# Patient Record
Sex: Male | Born: 1965 | Hispanic: Yes | Marital: Single | Smoking: Former smoker
Health system: Southern US, Community
[De-identification: ages and names within clinical notes are randomized; demographics above are authoritative.]

## PROBLEM LIST (undated history)

## (undated) DIAGNOSIS — B20 Human immunodeficiency virus [HIV] disease: Secondary | ICD-10-CM

## (undated) DIAGNOSIS — Z21 Asymptomatic human immunodeficiency virus [HIV] infection status: Secondary | ICD-10-CM

## (undated) DIAGNOSIS — M4800 Spinal stenosis, site unspecified: Secondary | ICD-10-CM

## (undated) DIAGNOSIS — G5600 Carpal tunnel syndrome, unspecified upper limb: Secondary | ICD-10-CM

## (undated) DIAGNOSIS — G4700 Insomnia, unspecified: Secondary | ICD-10-CM

## (undated) DIAGNOSIS — F32A Depression, unspecified: Secondary | ICD-10-CM

## (undated) DIAGNOSIS — E785 Hyperlipidemia, unspecified: Secondary | ICD-10-CM

## (undated) HISTORY — DX: Carpal tunnel syndrome, unspecified upper limb: G56.00

## (undated) HISTORY — DX: Hyperlipidemia, unspecified: E78.5

## (undated) HISTORY — DX: Spinal stenosis, site unspecified: M48.00

## (undated) HISTORY — PX: WRIST ARTHROPLASTY: SHX1088

## (undated) HISTORY — DX: Human immunodeficiency virus (HIV) disease: B20

## (undated) HISTORY — DX: Asymptomatic human immunodeficiency virus (hiv) infection status: Z21

## (undated) HISTORY — DX: Depression, unspecified: F32.A

## (undated) HISTORY — DX: Insomnia, unspecified: G47.00

---

## 2004-03-17 ENCOUNTER — Emergency Department (HOSPITAL_COMMUNITY): Admission: EM | Admit: 2004-03-17 | Discharge: 2004-03-17 | Payer: Self-pay | Admitting: Emergency Medicine

## 2006-07-23 ENCOUNTER — Encounter: Admission: RE | Admit: 2006-07-23 | Discharge: 2006-07-23 | Payer: Self-pay | Admitting: Occupational Medicine

## 2006-12-25 ENCOUNTER — Encounter: Admission: RE | Admit: 2006-12-25 | Discharge: 2006-12-25 | Payer: Self-pay | Admitting: Internal Medicine

## 2006-12-25 ENCOUNTER — Ambulatory Visit: Payer: Self-pay | Admitting: Internal Medicine

## 2006-12-25 DIAGNOSIS — B20 Human immunodeficiency virus [HIV] disease: Secondary | ICD-10-CM | POA: Insufficient documentation

## 2006-12-25 LAB — CONVERTED CEMR LAB
ALT: 18 units/L (ref 0–53)
AST: 17 units/L (ref 0–37)
Albumin: 4.1 g/dL (ref 3.5–5.2)
Alkaline Phosphatase: 76 units/L (ref 39–117)
Basophils Absolute: 0 10*3/uL (ref 0.0–0.1)
Basophils Relative: 0 % (ref 0–1)
Bilirubin Urine: NEGATIVE
CO2: 24 meq/L (ref 19–32)
Chloride: 105 meq/L (ref 96–112)
GC Probe Amp, Urine: NEGATIVE
Glucose, Bld: 97 mg/dL (ref 70–99)
HCT: 44.6 % (ref 39.0–52.0)
HCV Ab: NEGATIVE
HCV Ab: NEGATIVE
HDL: 48 mg/dL (ref >39)
HIV 1 RNA Quant: 127000 copies/mL — ABNORMAL HIGH (ref <50)
HIV-1 antibody: POSITIVE — AB
HIV-2 Ab: NEGATIVE
HIV: REACTIVE
Hep B Core Total Ab: POSITIVE — AB
Hep B S Ab: POSITIVE
Hepatitis B Surface Ag: NEGATIVE
LDL Cholesterol: 87 mg/dL (ref 0–99)
Leukocytes, UA: NEGATIVE
Lymphocytes Relative: 27 % (ref 12–46)
Lymphs Abs: 1.6 10*3/uL (ref 0.7–3.3)
Monocytes Relative: 12 % — ABNORMAL HIGH (ref 3–11)
Nitrite: NEGATIVE
Platelets: 306 10*3/uL (ref 150–400)
RBC: 5.24 M/uL (ref 4.22–5.81)
RDW: 14 % (ref 11.5–14.0)
Total Bilirubin: 0.8 mg/dL (ref 0.3–1.2)
Triglycerides: 113 mg/dL (ref <150)
WBC: 6 10*3/uL (ref 4.0–10.5)

## 2006-12-26 DIAGNOSIS — B1910 Unspecified viral hepatitis B without hepatic coma: Secondary | ICD-10-CM | POA: Insufficient documentation

## 2007-01-09 ENCOUNTER — Ambulatory Visit: Payer: Self-pay | Admitting: Internal Medicine

## 2007-01-13 ENCOUNTER — Encounter: Payer: Self-pay | Admitting: Internal Medicine

## 2007-01-27 ENCOUNTER — Ambulatory Visit: Payer: Self-pay | Admitting: Internal Medicine

## 2007-01-27 ENCOUNTER — Encounter: Admission: RE | Admit: 2007-01-27 | Discharge: 2007-01-27 | Payer: Self-pay | Admitting: Internal Medicine

## 2007-01-27 LAB — CONVERTED CEMR LAB
CO2: 23 meq/L (ref 19–32)
Calcium: 9.3 mg/dL (ref 8.4–10.5)
Chloride: 105 meq/L (ref 96–112)
Creatinine, Ser: 1.03 mg/dL (ref 0.40–1.50)
Eosinophils Relative: 3 % (ref 0–5)
HCT: 44.5 % (ref 39.0–52.0)
HIV-1 RNA Quant, Log: 4.57 — ABNORMAL HIGH (ref <1.70)
Lymphocytes Relative: 41 % (ref 12–46)
Lymphs Abs: 2.1 10*3/uL (ref 0.7–3.3)
MCHC: 32.4 g/dL (ref 30.0–36.0)
MCV: 86.7 fL (ref 78.0–100.0)
Neutro Abs: 2.4 10*3/uL (ref 1.7–7.7)
Potassium: 4.6 meq/L (ref 3.5–5.3)
RBC: 5.13 M/uL (ref 4.22–5.81)
RDW: 14.2 % — ABNORMAL HIGH (ref 11.5–14.0)
Total Bilirubin: 0.9 mg/dL (ref 0.3–1.2)
Total Protein: 8 g/dL (ref 6.0–8.3)
WBC: 5.2 10*3/uL (ref 4.0–10.5)

## 2007-02-10 ENCOUNTER — Ambulatory Visit: Payer: Self-pay | Admitting: Internal Medicine

## 2007-02-10 DIAGNOSIS — J309 Allergic rhinitis, unspecified: Secondary | ICD-10-CM | POA: Insufficient documentation

## 2007-03-10 ENCOUNTER — Emergency Department (HOSPITAL_COMMUNITY): Admission: EM | Admit: 2007-03-10 | Discharge: 2007-03-10 | Payer: Self-pay | Admitting: Emergency Medicine

## 2007-03-15 ENCOUNTER — Ambulatory Visit (HOSPITAL_COMMUNITY): Admission: RE | Admit: 2007-03-15 | Discharge: 2007-03-15 | Payer: Self-pay | Admitting: Urology

## 2007-05-11 ENCOUNTER — Ambulatory Visit: Payer: Self-pay | Admitting: Internal Medicine

## 2007-05-11 ENCOUNTER — Encounter: Admission: RE | Admit: 2007-05-11 | Discharge: 2007-05-11 | Payer: Self-pay | Admitting: Internal Medicine

## 2007-05-11 LAB — CONVERTED CEMR LAB
ALT: 16 units/L (ref 0–53)
Albumin: 4.3 g/dL (ref 3.5–5.2)
Alkaline Phosphatase: 68 units/L (ref 39–117)
Basophils Absolute: 0 10*3/uL (ref 0.0–0.1)
Calcium: 9.1 mg/dL (ref 8.4–10.5)
Creatinine, Ser: 0.89 mg/dL (ref 0.40–1.50)
Glucose, Bld: 92 mg/dL (ref 70–99)
HIV-1 RNA Quant, Log: 3.74 — ABNORMAL HIGH (ref <1.70)
Lymphs Abs: 1.7 10*3/uL (ref 0.7–4.0)
MCHC: 32.4 g/dL (ref 30.0–36.0)
MCV: 86.3 fL (ref 78.0–100.0)
Monocytes Relative: 14 % — ABNORMAL HIGH (ref 3–12)
Neutro Abs: 2.5 10*3/uL (ref 1.7–7.7)
Neutrophils Relative %: 51 % (ref 43–77)
RBC: 4.75 M/uL (ref 4.22–5.81)
RDW: 13.6 % (ref 11.5–15.5)
Sodium: 142 meq/L (ref 135–145)

## 2007-05-20 ENCOUNTER — Encounter: Payer: Self-pay | Admitting: Internal Medicine

## 2007-05-20 LAB — CONVERTED CEMR LAB: HCV Ab: NEGATIVE

## 2007-06-01 ENCOUNTER — Encounter: Payer: Self-pay | Admitting: Internal Medicine

## 2007-06-02 ENCOUNTER — Encounter (INDEPENDENT_AMBULATORY_CARE_PROVIDER_SITE_OTHER): Payer: Self-pay | Admitting: *Deleted

## 2007-06-02 ENCOUNTER — Ambulatory Visit: Payer: Self-pay | Admitting: Internal Medicine

## 2007-06-02 DIAGNOSIS — M5126 Other intervertebral disc displacement, lumbar region: Secondary | ICD-10-CM | POA: Insufficient documentation

## 2007-06-26 ENCOUNTER — Encounter: Payer: Self-pay | Admitting: Internal Medicine

## 2007-08-31 ENCOUNTER — Encounter: Admission: RE | Admit: 2007-08-31 | Discharge: 2007-08-31 | Payer: Self-pay | Admitting: Internal Medicine

## 2007-08-31 ENCOUNTER — Ambulatory Visit: Payer: Self-pay | Admitting: Internal Medicine

## 2007-08-31 LAB — CONVERTED CEMR LAB
ALT: 18 units/L (ref 0–53)
Basophils Absolute: 0 10*3/uL (ref 0.0–0.1)
Basophils Relative: 0 % (ref 0–1)
CO2: 24 meq/L (ref 19–32)
Creatinine, Ser: 1.07 mg/dL (ref 0.40–1.50)
Eosinophils Absolute: 0.1 10*3/uL (ref 0.0–0.7)
Glucose, Bld: 79 mg/dL (ref 70–99)
HIV-1 RNA Quant, Log: 3.14 — ABNORMAL HIGH (ref <1.70)
Hemoglobin: 14.5 g/dL (ref 13.0–17.0)
Lymphocytes Relative: 32 % (ref 12–46)
Lymphs Abs: 2 10*3/uL (ref 0.7–4.0)
Monocytes Absolute: 0.5 10*3/uL (ref 0.1–1.0)
Monocytes Relative: 9 % (ref 3–12)
Neutrophils Relative %: 58 % (ref 43–77)
RBC: 5.08 M/uL (ref 4.22–5.81)
Total Bilirubin: 0.6 mg/dL (ref 0.3–1.2)

## 2007-09-04 ENCOUNTER — Encounter: Payer: Self-pay | Admitting: Internal Medicine

## 2007-09-15 ENCOUNTER — Ambulatory Visit: Payer: Self-pay | Admitting: Internal Medicine

## 2007-12-14 ENCOUNTER — Ambulatory Visit: Payer: Self-pay | Admitting: Internal Medicine

## 2007-12-14 ENCOUNTER — Encounter: Admission: RE | Admit: 2007-12-14 | Discharge: 2007-12-14 | Payer: Self-pay | Admitting: Internal Medicine

## 2007-12-14 LAB — CONVERTED CEMR LAB
BUN: 14 mg/dL (ref 6–23)
Basophils Absolute: 0 10*3/uL (ref 0.0–0.1)
Chloride: 106 meq/L (ref 96–112)
Creatinine, Ser: 0.87 mg/dL (ref 0.40–1.50)
Glucose, Bld: 92 mg/dL (ref 70–99)
HCT: 41.7 % (ref 39.0–52.0)
Hemoglobin: 14.1 g/dL (ref 13.0–17.0)
MCHC: 33.8 g/dL (ref 30.0–36.0)
Neutro Abs: 2.5 10*3/uL (ref 1.7–7.7)
Neutrophils Relative %: 52 % (ref 43–77)
RDW: 13.5 % (ref 11.5–15.5)
Total Bilirubin: 0.9 mg/dL (ref 0.3–1.2)
Total Protein: 7.6 g/dL (ref 6.0–8.3)
WBC: 4.8 10*3/uL (ref 4.0–10.5)

## 2007-12-29 ENCOUNTER — Ambulatory Visit: Payer: Self-pay | Admitting: Internal Medicine

## 2007-12-29 DIAGNOSIS — B86 Scabies: Secondary | ICD-10-CM | POA: Insufficient documentation

## 2008-01-12 ENCOUNTER — Ambulatory Visit: Payer: Self-pay | Admitting: Internal Medicine

## 2008-01-22 ENCOUNTER — Ambulatory Visit: Payer: Self-pay | Admitting: Internal Medicine

## 2008-01-22 LAB — CONVERTED CEMR LAB
Calcium: 9.3 mg/dL (ref 8.4–10.5)
Chloride: 105 meq/L (ref 96–112)
Cholesterol: 201 mg/dL — ABNORMAL HIGH (ref 0–200)
Creatinine, Ser: 1.01 mg/dL (ref 0.40–1.50)
Glucose, Bld: 88 mg/dL (ref 70–99)
Total CHOL/HDL Ratio: 3.8

## 2008-01-25 ENCOUNTER — Encounter: Payer: Self-pay | Admitting: Internal Medicine

## 2008-02-04 ENCOUNTER — Ambulatory Visit: Payer: Self-pay | Admitting: Internal Medicine

## 2008-02-04 LAB — CONVERTED CEMR LAB
ALT: 15 units/L (ref 0–53)
AST: 14 units/L (ref 0–37)
Bilirubin Urine: NEGATIVE
CO2: 25 meq/L (ref 19–32)
Creatinine, Ser: 0.98 mg/dL (ref 0.40–1.50)
HIV 1 RNA Quant: 18033 copies/mL
Indirect Bilirubin: 0.5 mg/dL (ref 0.0–0.9)
Ketones, ur: NEGATIVE mg/dL
Phosphorus: 3.8 mg/dL (ref 2.3–4.6)
Potassium: 4.3 meq/L (ref 3.5–5.3)
Protein, ur: NEGATIVE mg/dL
Total Bilirubin: 0.6 mg/dL (ref 0.3–1.2)
Total Protein: 7.6 g/dL (ref 6.0–8.3)

## 2008-03-02 ENCOUNTER — Ambulatory Visit: Payer: Self-pay | Admitting: Internal Medicine

## 2008-03-31 ENCOUNTER — Ambulatory Visit: Payer: Self-pay | Admitting: Internal Medicine

## 2008-03-31 LAB — CONVERTED CEMR LAB
AST: 21 units/L (ref 0–37)
Alkaline Phosphatase: 78 units/L (ref 39–117)
Basophils Relative: 0 % (ref 0–1)
Bilirubin, Direct: 0.1 mg/dL (ref 0.0–0.3)
CO2: 21 meq/L (ref 19–32)
Calcium: 9 mg/dL (ref 8.4–10.5)
Creatinine, Ser: 1.13 mg/dL (ref 0.40–1.50)
Glucose, Bld: 96 mg/dL (ref 70–99)
Indirect Bilirubin: 0.5 mg/dL (ref 0.0–0.9)
Lymphs Abs: 1.9 10*3/uL (ref 0.7–4.0)
Monocytes Absolute: 0.5 10*3/uL (ref 0.1–1.0)
Platelets: 287 10*3/uL (ref 150–400)
RBC: 5.09 M/uL (ref 4.22–5.81)
RDW: 13.9 % (ref 11.5–15.5)
Total Protein: 7.8 g/dL (ref 6.0–8.3)

## 2008-05-23 ENCOUNTER — Encounter: Payer: Self-pay | Admitting: Internal Medicine

## 2008-05-23 ENCOUNTER — Ambulatory Visit: Payer: Self-pay | Admitting: Internal Medicine

## 2008-05-23 LAB — CONVERTED CEMR LAB
CD4 Count: 652 microliters
Chloride: 105 meq/L (ref 96–112)
Cholesterol: 212 mg/dL — ABNORMAL HIGH (ref 0–200)
Glucose, Bld: 101 mg/dL — ABNORMAL HIGH (ref 70–99)
HDL: 52 mg/dL (ref >39)
HIV 1 RNA Quant: 39 copies/mL
Hep A Total Ab: POSITIVE — AB
Hep B Core Total Ab: POSITIVE — AB
LDL Cholesterol: 128 mg/dL — ABNORMAL HIGH (ref 0–99)
Sodium: 141 meq/L (ref 135–145)
Total CHOL/HDL Ratio: 4.1
Triglycerides: 159 mg/dL — ABNORMAL HIGH (ref <150)
VLDL: 32 mg/dL (ref 0–40)

## 2008-05-25 ENCOUNTER — Encounter: Payer: Self-pay | Admitting: Internal Medicine

## 2008-05-25 LAB — CONVERTED CEMR LAB: Hepatitis B Surface Ag: NEGATIVE

## 2008-06-07 ENCOUNTER — Ambulatory Visit: Payer: Self-pay | Admitting: Internal Medicine

## 2008-06-07 DIAGNOSIS — J1189 Influenza due to unidentified influenza virus with other manifestations: Secondary | ICD-10-CM | POA: Insufficient documentation

## 2008-06-20 ENCOUNTER — Encounter: Payer: Self-pay | Admitting: Internal Medicine

## 2008-07-15 ENCOUNTER — Encounter: Payer: Self-pay | Admitting: *Deleted

## 2008-07-21 ENCOUNTER — Ambulatory Visit: Payer: Self-pay | Admitting: Internal Medicine

## 2008-10-13 ENCOUNTER — Ambulatory Visit: Payer: Self-pay | Admitting: Internal Medicine

## 2008-11-04 ENCOUNTER — Encounter: Payer: Self-pay | Admitting: *Deleted

## 2009-01-06 ENCOUNTER — Ambulatory Visit: Payer: Self-pay | Admitting: Internal Medicine

## 2009-01-14 ENCOUNTER — Emergency Department (HOSPITAL_COMMUNITY): Admission: EM | Admit: 2009-01-14 | Discharge: 2009-01-14 | Payer: Self-pay | Admitting: Emergency Medicine

## 2009-04-18 ENCOUNTER — Ambulatory Visit: Payer: Self-pay | Admitting: Internal Medicine

## 2009-04-18 ENCOUNTER — Encounter: Payer: Self-pay | Admitting: Internal Medicine

## 2009-04-18 LAB — CONVERTED CEMR LAB
ALT: 17 units/L (ref 0–53)
AST: 17 units/L (ref 0–37)
Albumin: 4.3 g/dL (ref 3.5–5.2)
Alkaline Phosphatase: 84 units/L (ref 39–117)
CD4 Count: 540 microliters
CO2: 24 meq/L (ref 19–32)
Glucose, Bld: 80 mg/dL (ref 70–99)
HIV 1 RNA Quant: 39 copies/mL
Phosphorus: 2.9 mg/dL (ref 2.3–4.6)
Sodium: 140 meq/L (ref 135–145)
Total Bilirubin: 0.4 mg/dL (ref 0.3–1.2)
Total CHOL/HDL Ratio: 3.3
Triglycerides: 155 mg/dL — ABNORMAL HIGH (ref <150)

## 2009-05-09 ENCOUNTER — Ambulatory Visit: Payer: Self-pay | Admitting: Internal Medicine

## 2009-05-09 DIAGNOSIS — L989 Disorder of the skin and subcutaneous tissue, unspecified: Secondary | ICD-10-CM | POA: Insufficient documentation

## 2009-06-15 ENCOUNTER — Encounter: Payer: Self-pay | Admitting: Internal Medicine

## 2009-08-09 ENCOUNTER — Ambulatory Visit: Payer: Self-pay | Admitting: Internal Medicine

## 2009-08-09 LAB — CONVERTED CEMR LAB
ALT: 23 units/L (ref 0–53)
Albumin: 4.3 g/dL (ref 3.5–5.2)
Alkaline Phosphatase: 96 units/L (ref 39–117)
Bilirubin, Direct: 0.1 mg/dL (ref 0.0–0.3)
Cholesterol: 185 mg/dL (ref 0–200)
HDL: 56 mg/dL (ref >39)
HIV 1 RNA Quant: 39 copies/mL
Indirect Bilirubin: 0.3 mg/dL (ref 0.0–0.9)
Phosphorus: 3.8 mg/dL (ref 2.3–4.6)
Potassium: 4.2 meq/L (ref 3.5–5.3)
Sodium: 139 meq/L (ref 135–145)
Total Bilirubin: 0.4 mg/dL (ref 0.3–1.2)
Total CHOL/HDL Ratio: 3.3

## 2009-12-08 ENCOUNTER — Ambulatory Visit: Payer: Self-pay | Admitting: Internal Medicine

## 2009-12-08 LAB — CONVERTED CEMR LAB
Albumin: 4.8 g/dL (ref 3.5–5.2)
Alkaline Phosphatase: 67 units/L (ref 39–117)
BUN: 17 mg/dL (ref 6–23)
CO2: 27 meq/L (ref 19–32)
Chloride: 103 meq/L (ref 96–112)
Creatinine, Ser: 1.08 mg/dL (ref 0.40–1.50)
HCV Ab: NEGATIVE
Hemoglobin, Urine: NEGATIVE
Hep A Total Ab: POSITIVE — AB
Hepatitis B Surface Ag: NEGATIVE
Indirect Bilirubin: 0.5 mg/dL (ref 0.0–0.9)
Ketones, ur: NEGATIVE mg/dL
LDL Cholesterol: 119 mg/dL — ABNORMAL HIGH (ref 0–99)
Leukocytes, UA: NEGATIVE
Protein, ur: NEGATIVE mg/dL
Total Bilirubin: 0.6 mg/dL (ref 0.3–1.2)
Urine Glucose: NEGATIVE mg/dL
VLDL: 35 mg/dL (ref 0–40)

## 2010-03-27 ENCOUNTER — Ambulatory Visit: Payer: Self-pay | Admitting: Internal Medicine

## 2010-03-27 LAB — CONVERTED CEMR LAB
BUN: 12 mg/dL (ref 6–23)
Bilirubin, Direct: 0.1 mg/dL (ref 0.0–0.3)
CD4 Count: 553 microliters
CO2: 23 meq/L (ref 19–32)
Chloride: 106 meq/L (ref 96–112)
Cholesterol: 194 mg/dL (ref 0–200)
Glucose, Bld: 110 mg/dL — ABNORMAL HIGH (ref 70–99)
HIV 1 RNA Quant: 39 copies/mL
Indirect Bilirubin: 0.5 mg/dL (ref 0.0–0.9)
LDL Cholesterol: 108 mg/dL — ABNORMAL HIGH (ref 0–99)
Phosphorus: 3.4 mg/dL (ref 2.3–4.6)
Potassium: 4.4 meq/L (ref 3.5–5.3)
Sodium: 142 meq/L (ref 135–145)
Total Bilirubin: 0.6 mg/dL (ref 0.3–1.2)
Total CHOL/HDL Ratio: 3.2
VLDL: 26 mg/dL (ref 0–40)

## 2010-04-17 ENCOUNTER — Ambulatory Visit: Payer: Self-pay | Admitting: Internal Medicine

## 2010-04-17 DIAGNOSIS — G4700 Insomnia, unspecified: Secondary | ICD-10-CM | POA: Insufficient documentation

## 2010-07-01 LAB — CONVERTED CEMR LAB
ALT: 19 units/L (ref 0–53)
ALT: 20 units/L (ref 0–53)
AST: 17 units/L (ref 0–37)
AST: 19 units/L (ref 0–37)
AST: 22 units/L (ref 0–37)
AST: 24 units/L (ref 0–37)
Albumin: 3.9 g/dL (ref 3.5–5.2)
Albumin: 4 g/dL (ref 3.5–5.2)
Albumin: 4.2 g/dL (ref 3.5–5.2)
Albumin: 4.4 g/dL (ref 3.5–5.2)
Albumin: 4.7 g/dL (ref 3.5–5.2)
Alkaline Phosphatase: 67 units/L (ref 39–117)
Alkaline Phosphatase: 73 units/L (ref 39–117)
BUN: 14 mg/dL (ref 6–23)
Bilirubin Urine: NEGATIVE
Bilirubin, Direct: 0.1 mg/dL (ref 0.0–0.3)
Bilirubin, Direct: 0.1 mg/dL (ref 0.0–0.3)
CD4 Count: 472 microliters
CD4 Count: 656 microliters
CO2: 22 meq/L (ref 19–32)
Calcium: 9.1 mg/dL (ref 8.4–10.5)
Calcium: 9.2 mg/dL (ref 8.4–10.5)
Calcium: 9.2 mg/dL (ref 8.4–10.5)
Chloride: 104 meq/L (ref 96–112)
Chloride: 104 meq/L (ref 96–112)
Chloride: 105 meq/L (ref 96–112)
Cholesterol: 132 mg/dL (ref 0–200)
Cholesterol: 177 mg/dL (ref 0–200)
Creatinine, Ser: 0.96 mg/dL (ref 0.40–1.50)
Creatinine, Ser: 1.13 mg/dL (ref 0.40–1.50)
Creatinine, Ser: 1.16 mg/dL (ref 0.40–1.50)
Creatinine, Ser: 1.21 mg/dL (ref 0.40–1.50)
Creatinine, Urine: 103.2 mg/dL
GFR calc Af Amer: >60 mL/min (ref >60)
Glucose, Bld: 102 mg/dL — ABNORMAL HIGH (ref 70–99)
Glucose, Bld: 103 mg/dL — ABNORMAL HIGH (ref 70–99)
Glucose, Bld: 84 mg/dL (ref 70–99)
Glucose, Bld: 92 mg/dL (ref 70–99)
Glucose, Urine, Semiquant: NEGATIVE
HDL: 32 mg/dL — ABNORMAL LOW (ref >39)
HDL: 45 mg/dL (ref >39)
HIV 1 RNA Quant: 39 copies/mL
HIV 1 RNA Quant: 39 copies/mL
HIV-1 RNA Quant, Log: 3.53 — ABNORMAL HIGH (ref <1.70)
Indirect Bilirubin: 0.3 mg/dL (ref 0.0–0.9)
Indirect Bilirubin: 0.4 mg/dL (ref 0.0–0.9)
Indirect Bilirubin: 0.8 mg/dL (ref 0.0–0.9)
Phosphorus: 2.7 mg/dL (ref 2.3–4.6)
Potassium: 4.1 meq/L (ref 3.5–5.3)
Sodium: 137 meq/L (ref 135–145)
Sodium: 141 meq/L (ref 135–145)
Specific Gravity, Urine: 1.01
Total Bilirubin: 0.4 mg/dL (ref 0.3–1.2)
Total Bilirubin: 0.8 mg/dL (ref 0.3–1.2)
Total CHOL/HDL Ratio: 4.1
Total CHOL/HDL Ratio: 4.3
Total Protein, Urine: 5
Total Protein: 7.4 g/dL (ref 6.0–8.3)
Total Protein: 7.4 g/dL (ref 6.0–8.3)
Total Protein: 7.5 g/dL (ref 6.0–8.3)
Total Protein: 8.1 g/dL (ref 6.0–8.3)
Triglycerides: 120 mg/dL (ref <150)
Triglycerides: 184 mg/dL — ABNORMAL HIGH (ref <150)
Triglycerides: 254 mg/dL — ABNORMAL HIGH (ref <150)
Urobilinogen, UA: 0.2
VLDL: 51 mg/dL — ABNORMAL HIGH (ref 0–40)
WBC Urine, dipstick: NEGATIVE
pH: 6.5

## 2010-07-03 NOTE — Miscellaneous (Signed)
Summary: HIV-1 RNA, CD4 (RESEARCH)  Clinical Lists Changes  Observations: Added new observation of CD4 COUNT: 624 microliters (08/09/2009 10:03) Added new observation of HIV1RNA QA: 39 copies/mL (08/09/2009 10:03)

## 2010-07-03 NOTE — Assessment & Plan Note (Signed)
Summary: STUDY APPT/ LH    Current Allergies: No known allergies  Vital Signs:  Patient profile:   45 year old male Weight:      160 pounds (72.73 kg) BMI:     23.04 Temp:     98.1 degrees F oral Pulse rate:   60 / minute Resp:     16 per minute BP sitting:   115 / 84  (right arm) Is Patient Diabetic? No Pain Assessment Patient in pain? no      Nutritional Status BMI of 19 -24 = normal  Does patient need assistance? Functional Status Self care Ambulation Normal   Patient here for week 112 study visit. He denies any new problems, except for insomnia, which he thinks is related to stress. He plans on seeing Dr. Philipp Deputy in a few weeks and will ask her about a sleep aid.Deirdre Evener RN  March 27, 2010 3:24 PM    Other Orders: Est. Patient Research Study [id number]) T-Basic Metabolic Panel [id number]) T-Hepatic Function [phone]) T-Lipid Profile [phone]) T-Phosphorus [phone])

## 2010-07-03 NOTE — Assessment & Plan Note (Signed)
Summary: f/u [mkj]   CC:  follow-up visit, lab results, and c/o trouble sleeping.  History of Present Illness: Pt here for f/u.  He is part of the ACTG study and is doing well. Once it ends he would like to switch to Atripla due to co-pays. His genotype showed a niave virus. Pt c/oof intermittant insomnia would like a refill of his hydroxyzine.  Preventive Screening-Counseling & Management  Alcohol-Tobacco     Alcohol drinks/day: 0     Smoking Status: never     Year Quit: 20 years ago  Caffeine-Diet-Exercise     Caffeine use/day: coffee 4 per day     Does Patient Exercise: yes  Safety-Violence-Falls     Seat Belt Use: yes      Drug Use:  No.    Comments: pt. given condoms   Updated Prior Medication List: CLARITIN 10 MG  TABS (LORATADINE) Take 1 tablet by mouth once a day TRUVADA 200-300 MG TABS (EMTRICITABINE-TENOFOVIR) 1 by mouth daily PREZISTA 400 MG TABS (DARUNAVIR ETHANOLATE) 2 by mouth daily with norvir NORVIR 100 MG CAPS (RITONAVIR) Take 1 daily with darunavir HYDROXYZINE HCL 10 MG TABS (HYDROXYZINE HCL) Take 1 tablet by mouth at bedtime as needed  Current Allergies (reviewed today): No known allergies  Review of Systems  The patient denies anorexia, fever, and weight loss.    Vital Signs:  Patient profile:   45 year old male Height:      70 inches (177.80 cm) Weight:      168.2 pounds (76.45 kg) BMI:     24.22 Temp:     98.3 degrees F (36.83 degrees C) oral Pulse rate:   49 / minute BP sitting:   104 / 66  (right arm)  Vitals Entered By: Wendall Mola CMA Duncan Dull) (April 17, 2010 3:37 PM) CC: follow-up visit, lab results, c/o trouble sleeping Is Patient Diabetic? No Pain Assessment Patient in pain? no      Nutritional Status BMI of 19 -24 = normal Nutritional Status Detail appetite "good"  Have you ever been in a relationship where you felt threatened, hurt or afraid?No   Does patient need assistance? Functional Status Self  care Ambulation Normal Comments only one missed dose of meds since last visit   Physical Exam  General:  alert, well-developed, well-nourished, and well-hydrated.   Head:  normocephalic and atraumatic.   Mouth:  pharynx pink and moist.   Lungs:  normal breath sounds.     Impression & Recommendations:  Problem # 1:  HIV INFECTION (ICD-042) Ptto continue current meds and f/u per study protocol. Orders: Est. Patient Level III (81191)  Diagnostics Reviewed:  HIV: REACTIVE (12/25/2006)   HIV-Western blot: Positive (12/25/2006)   CD4: 853 (12/08/2009)   WBC: 4.9 (03/31/2008)   Hgb: 14.6 (03/31/2008)   HCT: 44.0 (03/31/2008)   Platelets: 287 (03/31/2008) HIV genotype: See Comment (12/29/2007)   HIV-1 RNA: 39 (12/08/2009)   HBSAg: NEG (12/08/2009)  Problem # 2:  INSOMNIA (ICD-780.52) refill hydroxyzine  Medications Added to Medication List This Visit: 1)  Hydroxyzine Hcl 10 Mg Tabs (Hydroxyzine hcl) .... Take 1 tablet by mouth at bedtime as needed  Patient Instructions: 1)  follow-up per study protocol Prescriptions: HYDROXYZINE HCL 10 MG TABS (HYDROXYZINE HCL) Take 1 tablet by mouth at bedtime as needed  #30 x 5   Entered and Authorized by:   Yisroel Ramming MD   Signed by:   Yisroel Ramming MD on 04/17/2010   Method used:   Print  then Give to Patient   RxID:   [id number]     Appended Document: f/u [mkj]    Clinical Lists Changes  Orders: Added new Service order of Influenza Vaccine NON MCR (14782) - Signed Observations: Added new observation of FLU VAX#1VIS: 12/26/09 version given April 17, 2010. (04/17/2010 16:58) Added new observation of FLU VAXLOT: 1103 3P (04/17/2010 16:58) Added new observation of FLU VAX EXP: 09/02/2010 (04/17/2010 16:58) Added new observation of FLU VAXBY: Wendall Mola CMA ( AAMA) (04/17/2010 16:58) Added new observation of FLU VAXRTE: IM (04/17/2010 16:58) Added new observation of FLU VAX DSE: 0.5 ml (04/17/2010 16:58) Added new  observation of FLU VAXMFR: Novartis (04/17/2010 16:58) Added new observation of FLU VAX SITE: right deltoid (04/17/2010 16:58) Added new observation of FLU VAX: Fluvax Non-MCR (04/17/2010 16:58)       Immunizations Administered:  Influenza Vaccine # 1:    Vaccine Type: Fluvax Non-MCR    Site: right deltoid    Mfr: Novartis    Dose: 0.5 ml    Route: IM    Given by: Wendall Mola CMA ( AAMA)    Exp. Date: 09/02/2010    Lot #: [id number]    VIS given: 12/26/09 version given April 17, 2010.  Flu Vaccine Consent Questions:    Do you have a history of severe allergic reactions to this vaccine? no    Any prior history of allergic reactions to egg and/or gelatin? no    Do you have a sensitivity to the preservative Thimersol? no    Do you have a past history of Guillan-Barre Syndrome? no    Do you currently have an acute febrile illness? no    Have you ever had a severe reaction to latex? no    Vaccine information given and explained to patient? yes

## 2010-07-03 NOTE — Assessment & Plan Note (Signed)
Summary: STUDY APPT/ LH    Current Allergies: No known allergies  Vital Signs:  Patient profile:   45 year old male Height:      70 inches (177.80 cm) Weight:      158.75 pounds (72.16 kg) BMI:     22.86 Temp:     97.5 degrees F oral Pulse rate:   54 / minute Resp:     16 per minute BP sitting:   85 / 69  (right arm) Is Patient Diabetic? No Pain Assessment Patient in pain? no      Nutritional Status BMI of 19 -24 = normal  Does patient need assistance? Functional Status Self care Ambulation Normal   Patient here for week 96 study visit. He denies any new problems and has been very adherent with his meds. His hair has stopped falling out since he started taking biotin. I reminded him to make an appt. to see Dr. Philipp Deputy soon.Deirdre Evener RN  December 08, 2009 10:40 AM    Other Orders: Est. Patient Research Study [id number]) T-Basic Metabolic Panel [phone]) T-Hepatic Function [phone]) T-Hepatitis B Surface Antigen [phone]) T-Hepatitis C Antibody [phone]) T-Hepatitis A Antibody [phone]) T-Lipid Profile [id number]) T-Phosphorus [phone]) T-Urine Protein [id number]) T-Urinalysis [phone]) T-Urine Creatinine ([id number])  Process Orders Check Orders Results:     [facility]: ABN not required for this insurance Order queued for requisitioning for [facility]: December 08, 2009 9:25 AM  Tests Sent for requisitioning (December 08, 2009 9:25 AM):     12/08/2009: [facility] -- T-Basic Metabolic Panel [phone] (signed)     12/08/2009: [facility] -- T-Hepatic Function [phone] (signed)     12/08/2009: [facility] -- T-Hepatitis B Surface Antigen [[id number]] (signed)     12/08/2009: [facility] -- T-Hepatitis C Antibody [[id number]] (signed)     12/08/2009: [facility] -- T-Hepatitis A Antibody [[id number]] (signed)     12/08/2009: [facility] -- T-Lipid Profile [phone] (signed)     12/08/2009: [facility] -- T-Phosphorus [[id number]] (signed)     12/08/2009: [facility] -- T-Urine Protein [phone] (signed)     12/08/2009: [facility] -- T-Urinalysis [[id number]] (signed)     12/08/2009: [facility] -- T-Urine Creatinine [[id number]] (signed)

## 2010-07-03 NOTE — Miscellaneous (Signed)
Summary: HIV-1 RNA (Research)  Clinical Lists Changes  Observations: Added new observation of HIV1RNA QA: 39 copies/mL (03/31/2008 10:58)

## 2010-07-03 NOTE — Consult Note (Signed)
Summary: [facility]  [facility]   Imported By: Florinda Marker 06/28/2009 15:36:07  _____________________________________________________________________  External Attachment:    Type:   Image     Comment:   External Document

## 2010-07-03 NOTE — Miscellaneous (Signed)
Summary: HIV-1 RNA, CD4 (RESEARCH)  Clinical Lists Changes  Observations: Added new observation of CD4 COUNT: 853 microliters (12/08/2009 13:13) Added new observation of HIV1RNA QA: 39 copies/mL (12/08/2009 13:13)

## 2010-07-03 NOTE — Assessment & Plan Note (Signed)
Summary: STUDY APPT/ LH    Current Allergies: No known allergies  Vital Signs:  Patient profile:   45 year old male Weight:      163.7 pounds (74.41 kg) BMI:     23.91 Temp:     97.5 degrees F oral Pulse rate:   60 / minute BP sitting:   95 / 68  (right arm) Is Patient Diabetic? No Pain Assessment Patient in pain? no      Nutritional Status BMI of 19 -24 = normal  Does patient need assistance? Functional Status Self care Ambulation Normal   Patient here for week 80 study visit. He has recently been seen by dermatology for hyperpigmentation and alopecia. He was started on biotin for that. He had noticed his scalp had some bare patches appr. 2 months ago. He was also concerned about the darkened skin tones on his face, but the physician did not think they were related to his HIV meds. Deirdre Evener RN  August 09, 2009 4:09 PM    Other Orders: Est. Patient Research Study [id number]) T-Basic Metabolic Panel [phone]) T-Hepatic Function [id number]) T-Lipid Profile [phone]) T-Phosphorus [phone]) Process Orders Check Orders Results:     [facility]: ABN not required for this insurance Order queued for requisitioning for [facility]: August 09, 2009 3:08 PM  Tests Sent for requisitioning (August 09, 2009 3:08 PM):     08/09/2009: [facility] -- T-Basic Metabolic Panel [phone] (signed)     08/09/2009: [facility] -- T-Hepatic Function [phone] (signed)     08/09/2009: [facility] -- T-Lipid Profile [phone] (signed)     08/09/2009: [facility] -- T-Phosphorus [phone] (signed)

## 2010-07-03 NOTE — Miscellaneous (Signed)
Summary: HIV-1 RNA, CD4 (RESEARCH)  Clinical Lists Changes  Observations: Added new observation of CD4 COUNT: 553 microliters (03/27/2010 8:35) Added new observation of HIV1RNA QA: 39 copies/mL (03/27/2010 8:35)

## 2010-07-16 ENCOUNTER — Ambulatory Visit: Payer: Self-pay

## 2010-07-19 ENCOUNTER — Encounter: Payer: Self-pay | Admitting: Internal Medicine

## 2010-07-19 ENCOUNTER — Encounter: Payer: Self-pay | Admitting: Adult Health

## 2010-07-19 ENCOUNTER — Ambulatory Visit (INDEPENDENT_AMBULATORY_CARE_PROVIDER_SITE_OTHER): Payer: Self-pay

## 2010-07-19 DIAGNOSIS — B20 Human immunodeficiency virus [HIV] disease: Secondary | ICD-10-CM

## 2010-07-19 LAB — CONVERTED CEMR LAB
ALT: 19 units/L (ref 0–53)
AST: 20 units/L (ref 0–37)
Alkaline Phosphatase: 76 units/L (ref 39–117)
BUN: 15 mg/dL (ref 6–23)
Bilirubin, Direct: 0.1 mg/dL (ref 0.0–0.3)
Calcium: 9.7 mg/dL (ref 8.4–10.5)
Cholesterol: 186 mg/dL (ref 0–200)
Creatinine, Ser: 1.06 mg/dL (ref 0.40–1.50)
Glucose, Bld: 63 mg/dL — ABNORMAL LOW (ref 70–99)
HIV 1 RNA Quant: 101 copies/mL
Indirect Bilirubin: 0.4 mg/dL (ref 0.0–0.9)
Triglycerides: 131 mg/dL (ref <150)

## 2010-07-25 NOTE — Assessment & Plan Note (Signed)
   Allergies: No Known Drug Allergies   Other Orders: Est. Patient Research Study [id number]) T-Basic Metabolic Panel [phone]) T-Hepatic Function [phone]) T-Lipid Profile [phone]) T-Phosphorus [phone])   Orders Added: 1)  Est. Patient Research Study [04200] 2)  T-Basic Metabolic Panel [[id number]] 3)  T-Hepatic Function [[id number]] 4)  T-Lipid Profile [[id number]] 5)  T-Phosphorus [[id number]]

## 2010-08-09 NOTE — Miscellaneous (Signed)
Summary: HIV-1 RNA, CD4 (RESEARCH)  Clinical Lists Changes  Observations: Added new observation of CD4 COUNT: 595 microliters (07/19/2010 14:33) Added new observation of CD4 %: 37.2 % (07/19/2010 14:33) Added new observation of HIV1RNA QA: 101 copies/mL (07/19/2010 14:33)

## 2010-11-01 ENCOUNTER — Ambulatory Visit (INDEPENDENT_AMBULATORY_CARE_PROVIDER_SITE_OTHER): Payer: Self-pay | Admitting: *Deleted

## 2010-11-01 VITALS — BP 82/61 | HR 62 | Temp 97.7°F | Resp 14 | Ht 70.0 in | Wt 153.2 lb

## 2010-11-01 DIAGNOSIS — B20 Human immunodeficiency virus [HIV] disease: Secondary | ICD-10-CM

## 2010-11-01 LAB — POCT URINALYSIS DIPSTICK
Glucose, UA: NEGATIVE
Nitrite, UA: NEGATIVE
Spec Grav, UA: 1.025
Urobilinogen, UA: 1

## 2010-11-01 LAB — LIPID PANEL
Cholesterol: 175 mg/dL (ref 0–200)
LDL Cholesterol: 89 mg/dL (ref 0–99)
VLDL: 27 mg/dL (ref 0–40)

## 2010-11-01 LAB — COMPREHENSIVE METABOLIC PANEL
Albumin: 4.5 g/dL (ref 3.5–5.2)
Alkaline Phosphatase: 70 U/L (ref 39–117)
CO2: 27 mEq/L (ref 19–32)
Calcium: 9.5 mg/dL (ref 8.4–10.5)
Chloride: 104 mEq/L (ref 96–112)
Glucose, Bld: 83 mg/dL (ref 70–99)
Potassium: 4.8 mEq/L (ref 3.5–5.3)
Sodium: 138 mEq/L (ref 135–145)
Total Protein: 7.3 g/dL (ref 6.0–8.3)

## 2010-11-01 NOTE — Progress Notes (Signed)
11/01/2010: Pt here for Study A5257/5001 week 144. Pt denies any new findings and feels well. Pt denies having problems adhering to ARV regimen. Pt received study medications, $75 gift card for co-pay receipts, and $20 gift card for the study visit. Next study visit scheduled for Wednesday, February 06, 2011 at 1500. -- Tacey Heap RN

## 2010-11-02 LAB — PROTEIN, URINE, RANDOM: Total Protein, Urine: 7 mg/dL

## 2010-11-02 LAB — CREATININE, URINE, RANDOM: Creatinine, Urine: 223.8 mg/dL

## 2010-11-07 ENCOUNTER — Inpatient Hospital Stay (INDEPENDENT_AMBULATORY_CARE_PROVIDER_SITE_OTHER)
Admission: RE | Admit: 2010-11-07 | Discharge: 2010-11-07 | Disposition: A | Discharge status: Home / Self Care | Payer: BC Managed Care – PPO | Source: Ambulatory Visit | Attending: Family Medicine | Admitting: Family Medicine

## 2010-11-07 ENCOUNTER — Ambulatory Visit (INDEPENDENT_AMBULATORY_CARE_PROVIDER_SITE_OTHER): Payer: BC Managed Care – PPO

## 2010-11-07 DIAGNOSIS — S82899A Other fracture of unspecified lower leg, initial encounter for closed fracture: Secondary | ICD-10-CM

## 2010-11-29 ENCOUNTER — Encounter: Payer: Self-pay | Admitting: Adult Health

## 2010-12-25 ENCOUNTER — Other Ambulatory Visit: Payer: Self-pay | Admitting: Licensed Clinical Social Worker

## 2010-12-25 DIAGNOSIS — B20 Human immunodeficiency virus [HIV] disease: Secondary | ICD-10-CM

## 2010-12-25 MED ORDER — RITONAVIR 100 MG PO TABS: 100.0000 mg | ORAL_TABLET | Freq: Every day | ORAL | Status: DC

## 2011-02-06 ENCOUNTER — Encounter: Payer: Self-pay | Admitting: *Deleted

## 2011-02-06 ENCOUNTER — Ambulatory Visit (INDEPENDENT_AMBULATORY_CARE_PROVIDER_SITE_OTHER): Payer: BC Managed Care – PPO | Admitting: *Deleted

## 2011-02-06 VITALS — BP 108/76 | HR 64 | Temp 98.3°F | Resp 16 | Wt 152.0 lb

## 2011-02-06 DIAGNOSIS — Z23 Encounter for immunization: Secondary | ICD-10-CM

## 2011-02-06 DIAGNOSIS — Z0000 Encounter for general adult medical examination without abnormal findings: Secondary | ICD-10-CM

## 2011-02-06 DIAGNOSIS — B20 Human immunodeficiency virus [HIV] disease: Secondary | ICD-10-CM

## 2011-02-06 LAB — HEPATIC FUNCTION PANEL
ALT: 21 U/L (ref 0–53)
Albumin: 4.1 g/dL (ref 3.5–5.2)
Total Protein: 6.9 g/dL (ref 6.0–8.3)

## 2011-02-06 LAB — LIPID PANEL
Cholesterol: 192 mg/dL (ref 0–200)
HDL: 70 mg/dL (ref >39)
Total CHOL/HDL Ratio: 2.7 Ratio
Triglycerides: 118 mg/dL (ref <150)
VLDL: 24 mg/dL (ref 0–40)

## 2011-02-06 LAB — BASIC METABOLIC PANEL
Potassium: 4.1 mEq/L (ref 3.5–5.3)
Sodium: 137 mEq/L (ref 135–145)

## 2011-02-06 LAB — PHOSPHORUS: Phosphorus: 4.1 mg/dL (ref 2.3–4.6)

## 2011-02-06 MED ORDER — RITONAVIR 100 MG PO TABS: 100.0000 mg | ORAL_TABLET | Freq: Every day | ORAL | Status: DC

## 2011-02-06 NOTE — Progress Notes (Signed)
Patient here for week 160 study visit. He recently broke his ankle and had to wear a boot for awhile. He says it is not hurting anymore. Denies any other problems, except for losing weight. He says he has been working a lot of overtime, but is about 10 lbs under where he should be. He has been very adherent with his meds. He hasn't seen a provider since November (Dr. Philipp Deputy) so he is making an appt. to see Dr. Luciana Axe in a few weeks

## 2011-02-25 LAB — T-HELPER CELL (CD4) - (RCID CLINIC ONLY): CD4 T Cell Abs: 400

## 2011-02-28 LAB — T-HELPER CELL (CD4) - (RCID CLINIC ONLY): CD4 % Helper T Cell: 25 — ABNORMAL LOW

## 2011-03-07 ENCOUNTER — Encounter: Payer: Self-pay | Admitting: Internal Medicine

## 2011-03-07 ENCOUNTER — Ambulatory Visit: Payer: BC Managed Care – PPO | Admitting: Internal Medicine

## 2011-03-07 LAB — CD4/CD8 (T-HELPER/T-SUPPRESSOR CELL)
CD8 % Suppressor T Cell: 40.6
CD8: 853

## 2011-03-07 LAB — HIV-1 RNA QUANT-NO REFLEX-BLD: HIV-1 RNA Viral Load: 42

## 2011-03-11 LAB — T-HELPER CELL (CD4) - (RCID CLINIC ONLY)
CD4 % Helper T Cell: 23 — ABNORMAL LOW
CD4 T Cell Abs: 380 — ABNORMAL LOW

## 2011-03-14 LAB — DIFFERENTIAL
Basophils Absolute: 0
Lymphocytes Relative: 28
Lymphs Abs: 1.6
Neutro Abs: 3.2
Neutrophils Relative %: 57

## 2011-03-14 LAB — URINE CULTURE: Colony Count: NO GROWTH

## 2011-03-14 LAB — URINALYSIS, ROUTINE W REFLEX MICROSCOPIC
Glucose, UA: NEGATIVE
Hgb urine dipstick: NEGATIVE
Ketones, ur: NEGATIVE
Protein, ur: NEGATIVE
pH: 8

## 2011-03-14 LAB — GC/CHLAMYDIA PROBE AMP, GENITAL
Chlamydia, DNA Probe: NEGATIVE
GC Probe Amp, Genital: NEGATIVE

## 2011-03-14 LAB — CBC
HCT: 40
Platelets: 316
RDW: 13.4
WBC: 5.6

## 2011-03-18 LAB — T-HELPER CELL (CD4) - (RCID CLINIC ONLY)
CD4 % Helper T Cell: 16 — ABNORMAL LOW
CD4 T Cell Abs: 260 — ABNORMAL LOW

## 2011-04-01 ENCOUNTER — Other Ambulatory Visit: Payer: Self-pay | Admitting: *Deleted

## 2011-04-01 DIAGNOSIS — B20 Human immunodeficiency virus [HIV] disease: Secondary | ICD-10-CM

## 2011-04-01 MED ORDER — RITONAVIR 100 MG PO TABS: 100.0000 mg | ORAL_TABLET | Freq: Every day | ORAL | Status: DC

## 2011-04-11 ENCOUNTER — Telehealth: Payer: Self-pay | Admitting: *Deleted

## 2011-04-11 NOTE — Telephone Encounter (Signed)
Patient needs follow up office visit only, receives labs through study. Wendall Mola CMA

## 2011-04-16 ENCOUNTER — Ambulatory Visit (INDEPENDENT_AMBULATORY_CARE_PROVIDER_SITE_OTHER): Payer: BC Managed Care – PPO | Admitting: Internal Medicine

## 2011-04-16 ENCOUNTER — Encounter: Payer: Self-pay | Admitting: Internal Medicine

## 2011-04-16 VITALS — BP 131/82 | HR 93 | Temp 98.2°F | Ht 70.0 in | Wt 147.0 lb

## 2011-04-16 DIAGNOSIS — B20 Human immunodeficiency virus [HIV] disease: Secondary | ICD-10-CM

## 2011-04-16 NOTE — Progress Notes (Signed)
  Subjective:    Patient ID: Erik Bautista, male    DOB: [birth year], 45 y.o.   MRN: [id number]  HPIthis patient is here for his annual followup. He is in HIV patient who is in a study protocol and taking boost of persistent and Truvada. He has been taking it well and continues in the study with an undetectable viral load. Today he has no particular complaints. He does have some questions about one tab daily regimens but otherwise has no complaints.    Review of Systems  Constitutional: Negative for fever, chills, activity change and fatigue.  HENT: Negative for trouble swallowing.   Respiratory: Negative for cough and shortness of breath.   Cardiovascular: Negative for chest pain and palpitations.  Gastrointestinal: Negative for abdominal pain, diarrhea and constipation.  Genitourinary: Negative for dysuria and frequency.  Skin: Negative for pallor and rash.  Neurological: Negative for headaches.  Hematological: Negative for adenopathy.  Psychiatric/Behavioral: Negative for dysphoric mood.       Objective:   Physical Exam  Constitutional: He is oriented to person, place, and time. He appears well-developed and well-nourished. No distress.  HENT:  Mouth/Throat: Oropharynx is clear and moist. No oropharyngeal exudate.  Cardiovascular: Normal rate, regular rhythm and normal heart sounds.  Exam reveals no gallop and no friction rub.   No murmur heard. Pulmonary/Chest: Effort normal and breath sounds normal. No respiratory distress. He has no wheezes.  Abdominal: Soft. Bowel sounds are normal. He exhibits no distension. There is no tenderness.  Genitourinary: Penis normal. No penile tenderness.  Lymphadenopathy:    He has no cervical adenopathy.  Neurological: He is alert and oriented to person, place, and time.  Skin: Skin is warm and dry. No rash noted. No erythema.  Psychiatric: He has a normal mood and affect. His behavior is normal.          Assessment & Plan:

## 2011-04-16 NOTE — Assessment & Plan Note (Signed)
Patient continues to do well on his regimen for the study. If he doesn't finish the study and his medication regimen can be changed to one pill once a day option may be pursued. I did discuss the different types.the patient did receive condoms today and I did reiterate that he needs to use those with all sexual activity.his last LDL was 98 no indication for therapy peer. He does exercise and eat right as well.

## 2011-04-16 NOTE — Patient Instructions (Signed)
Let us know if you stop the study

## 2011-05-02 ENCOUNTER — Other Ambulatory Visit: Payer: Self-pay | Admitting: *Deleted

## 2011-05-02 DIAGNOSIS — B20 Human immunodeficiency virus [HIV] disease: Secondary | ICD-10-CM

## 2011-05-02 MED ORDER — RITONAVIR 100 MG PO TABS: 100.0000 mg | ORAL_TABLET | Freq: Every day | ORAL | Status: DC

## 2011-06-20 ENCOUNTER — Ambulatory Visit (INDEPENDENT_AMBULATORY_CARE_PROVIDER_SITE_OTHER): Payer: BC Managed Care – PPO | Admitting: *Deleted

## 2011-06-20 VITALS — BP 118/84 | HR 88 | Temp 98.5°F | Resp 16 | Wt 150.2 lb

## 2011-06-20 DIAGNOSIS — B20 Human immunodeficiency virus [HIV] disease: Secondary | ICD-10-CM

## 2011-06-20 LAB — CD4/CD8 (T-HELPER/T-SUPPRESSOR CELL)
CD4%: 36.2
CD4: 652

## 2011-06-20 NOTE — Progress Notes (Signed)
Patient here for his week 176 study visit. He denies any current problems. He received the last study meds today and will need prescriptions when he runs out of them, probably in about 5-6 months. We will see him back in May.

## 2011-06-21 LAB — COMPREHENSIVE METABOLIC PANEL
ALT: 20 U/L (ref 0–53)
AST: 21 U/L (ref 0–37)
CO2: 27 mEq/L (ref 19–32)
Calcium: 9.6 mg/dL (ref 8.4–10.5)
Chloride: 101 mEq/L (ref 96–112)
Creat: 1.28 mg/dL (ref 0.50–1.35)
Sodium: 139 mEq/L (ref 135–145)
Total Bilirubin: 0.5 mg/dL (ref 0.3–1.2)
Total Protein: 7.3 g/dL (ref 6.0–8.3)

## 2011-06-21 LAB — PHOSPHORUS: Phosphorus: 4.1 mg/dL (ref 2.3–4.6)

## 2011-06-21 LAB — LIPID PANEL
Total CHOL/HDL Ratio: 3 Ratio
VLDL: 22 mg/dL (ref 0–40)

## 2011-07-12 ENCOUNTER — Encounter: Payer: Self-pay | Admitting: Internal Medicine

## 2011-09-11 ENCOUNTER — Other Ambulatory Visit: Payer: Self-pay | Admitting: *Deleted

## 2011-09-11 DIAGNOSIS — B20 Human immunodeficiency virus [HIV] disease: Secondary | ICD-10-CM

## 2011-09-11 MED ORDER — RITONAVIR 100 MG PO TABS: 100.0000 mg | ORAL_TABLET | Freq: Every day | ORAL | Status: DC

## 2011-09-11 NOTE — Telephone Encounter (Signed)
Erik Bautista had called and left a message saying his norvir needed to be refilled.

## 2011-10-02 ENCOUNTER — Ambulatory Visit (INDEPENDENT_AMBULATORY_CARE_PROVIDER_SITE_OTHER): Payer: BC Managed Care – PPO | Admitting: *Deleted

## 2011-10-02 VITALS — BP 94/72 | HR 58 | Temp 97.6°F | Resp 16 | Ht 70.0 in | Wt 154.2 lb

## 2011-10-02 DIAGNOSIS — B20 Human immunodeficiency virus [HIV] disease: Secondary | ICD-10-CM

## 2011-10-02 LAB — POCT URINALYSIS DIPSTICK
Bilirubin, UA: NEGATIVE
Glucose, UA: NEGATIVE
Ketones, UA: NEGATIVE
Spec Grav, UA: >=1.03
Urobilinogen, UA: 0.2

## 2011-10-02 LAB — HEPATIC FUNCTION PANEL
Bilirubin, Direct: <0.1 mg/dL (ref 0.0–0.3)
Total Bilirubin: 0.4 mg/dL (ref 0.3–1.2)
Total Protein: 7.3 g/dL (ref 6.0–8.3)

## 2011-10-02 LAB — BASIC METABOLIC PANEL
BUN: 16 mg/dL (ref 6–23)
CO2: 26 mEq/L (ref 19–32)
Calcium: 9.4 mg/dL (ref 8.4–10.5)
Chloride: 104 mEq/L (ref 96–112)
Creat: 1.08 mg/dL (ref 0.50–1.35)
Glucose, Bld: 86 mg/dL (ref 70–99)

## 2011-10-02 LAB — CD4/CD8 (T-HELPER/T-SUPPRESSOR CELL): CD4: 895

## 2011-10-02 LAB — HEPATITIS B SURFACE ANTIGEN: Hepatitis B Surface Ag: NEGATIVE

## 2011-10-02 LAB — PHOSPHORUS: Phosphorus: 4.5 mg/dL (ref 2.3–4.6)

## 2011-10-02 LAB — HIV-1 RNA QUANT-NO REFLEX-BLD: HIV-1 RNA Viral Load: 59

## 2011-10-02 LAB — LIPID PANEL: LDL Cholesterol: 112 mg/dL — ABNORMAL HIGH (ref 0–99)

## 2011-10-02 NOTE — Progress Notes (Signed)
Patient here for his final A5257 study visit, which is the treatment study. He will be needing a prescription for meds soon and is considering switching his regimen to a one pill/day regimen, He denies any new problems. He was instructed to make an appt to see Dr. Luciana Axe soon before he runs out of meds. He will continue on the ALLRT study until September and then enroll into the [facility] study which is another observational study looking at HIV and Aging.

## 2011-10-03 LAB — CREATININE, URINE, RANDOM: Creatinine, Urine: 221.6 mg/dL

## 2011-10-03 LAB — PROTEIN, URINE, RANDOM: Total Protein, Urine: 10 mg/dL

## 2011-11-11 ENCOUNTER — Encounter: Payer: Self-pay | Admitting: Internal Medicine

## 2011-12-10 ENCOUNTER — Ambulatory Visit (INDEPENDENT_AMBULATORY_CARE_PROVIDER_SITE_OTHER): Payer: BC Managed Care – PPO | Admitting: *Deleted

## 2011-12-10 VITALS — BP 105/70 | HR 61 | Temp 97.6°F | Ht 69.0 in | Wt 147.8 lb

## 2011-12-10 DIAGNOSIS — B20 Human immunodeficiency virus [HIV] disease: Secondary | ICD-10-CM

## 2011-12-10 LAB — COMPREHENSIVE METABOLIC PANEL
ALT: 14 U/L (ref 0–53)
CO2: 29 mEq/L (ref 19–32)
Calcium: 9.2 mg/dL (ref 8.4–10.5)
Chloride: 102 mEq/L (ref 96–112)
Creat: 1.08 mg/dL (ref 0.50–1.35)
Glucose, Bld: 79 mg/dL (ref 70–99)
Total Protein: 7.4 g/dL (ref 6.0–8.3)

## 2011-12-10 LAB — CD4/CD8 (T-HELPER/T-SUPPRESSOR CELL)
CD4: 927
CD8 % Suppressor T Cell: 40.5
CD8: 932

## 2011-12-10 LAB — PROTEIN, URINE, RANDOM: Total Protein, Urine: 6 mg/dL

## 2011-12-10 LAB — LIPID PANEL
Cholesterol: 193 mg/dL (ref 0–200)
Total CHOL/HDL Ratio: 3 Ratio

## 2011-12-10 NOTE — Progress Notes (Signed)
Patient here for his final A5001 visit. He has been losing weight and has dropped 6 lbs since May and over 20 lbs since he started meds in 2009. This has been unintentional and he is getting concerned. He says his energy is good, but that he has been working a lot of overtime and forgetting to pack his lunch to take with him. He is drinking Ensure also. I told him to discuss with Dr. Luciana Axe when he sees him next. He denies any other problems. We will try to enroll him in the [facility] study when it opens this Fall.

## 2011-12-24 ENCOUNTER — Other Ambulatory Visit: Payer: Self-pay | Admitting: Licensed Clinical Social Worker

## 2011-12-24 DIAGNOSIS — L299 Pruritus, unspecified: Secondary | ICD-10-CM

## 2011-12-24 MED ORDER — HYDROXYZINE HCL 10 MG PO TABS: 10.0000 mg | ORAL_TABLET | Freq: Every evening | ORAL | Status: DC | PRN

## 2011-12-30 ENCOUNTER — Encounter: Payer: Self-pay | Admitting: Internal Medicine

## 2011-12-30 ENCOUNTER — Ambulatory Visit (INDEPENDENT_AMBULATORY_CARE_PROVIDER_SITE_OTHER): Payer: BC Managed Care – PPO | Admitting: Internal Medicine

## 2011-12-30 VITALS — BP 151/84 | HR 79 | Temp 98.1°F | Ht 70.0 in | Wt 152.0 lb

## 2011-12-30 DIAGNOSIS — B20 Human immunodeficiency virus [HIV] disease: Secondary | ICD-10-CM

## 2011-12-30 MED ORDER — DARUNAVIR ETHANOLATE 800 MG PO TABS: 800.0000 mg | ORAL_TABLET | Freq: Every day | ORAL | Status: DC

## 2011-12-30 MED ORDER — EMTRICITABINE-TENOFOVIR DF 200-300 MG PO TABS: 1.0000 | ORAL_TABLET | Freq: Every day | ORAL | Status: DC

## 2011-12-30 MED ORDER — RITONAVIR 100 MG PO TABS: 100.0000 mg | ORAL_TABLET | Freq: Every day | ORAL | Status: DC

## 2011-12-30 NOTE — Progress Notes (Signed)
  Subjective:    Patient ID: Erik Bautista, male    DOB: 1965-11-04, 46 y.o.   MRN: [id number]  HPI He comes in here for follow up of his HIV. The patient has recently completed a study and has been on a regimen of Norvir, Prezista and Truvada. He reports good compliance with medication intolerance with no missed doses. He has been interested in changing to once daily pill mainly to the co-pay charges. He otherwise is happy with his current regimen. He has had no recent hospitalizations. He has noted some weight loss over the last year and some over the last 4 years as well. He does endorse poor p.o. Intake due to his busy work schedule.   Review of Systems  Constitutional: Negative for fever, chills, appetite change, fatigue and unexpected weight change.  HENT: Negative for sore throat and trouble swallowing.   Respiratory: Negative for cough, shortness of breath and wheezing.   Cardiovascular: Negative for chest pain, palpitations and leg swelling.  Gastrointestinal: Negative for nausea, abdominal pain and diarrhea.  Musculoskeletal: Negative for myalgias, joint swelling and arthralgias.  Skin: Negative for rash.  Neurological: Negative for dizziness, weakness and headaches.  Hematological: Negative for adenopathy.  Psychiatric/Behavioral: Negative for dysphoric mood. The patient is not nervous/anxious.        Objective:   Physical Exam  Constitutional: He appears well-developed and well-nourished. No distress.  HENT:  Mouth/Throat: Oropharynx is clear and moist. No oropharyngeal exudate.  Cardiovascular: Normal rate, regular rhythm and normal heart sounds.  Exam reveals no gallop and no friction rub.   No murmur heard. Pulmonary/Chest: Effort normal and breath sounds normal. No respiratory distress. He has no wheezes. He has no rales.  Abdominal: Soft. Bowel sounds are normal. He exhibits no distension. There is no tenderness. There is no rebound.  Lymphadenopathy:    He has no  cervical adenopathy.  Skin: Skin is warm and dry. No rash noted.          Assessment & Plan:

## 2011-12-30 NOTE — Assessment & Plan Note (Signed)
He is alone his current regimen and actually is more interested in staying on this regimen if the co-pay feeds can be addressed. He was provided with information on co-pay cards. He will return in 4 months for routine followup. He also is going to establish with a primary care physician for his blood pressure which was notably elevated. He was counseled on reducing his salt intake. He was reminded to use condoms with all sexual activity.

## 2012-01-03 ENCOUNTER — Encounter: Payer: Self-pay | Admitting: Internal Medicine

## 2012-01-03 LAB — HIV-1 RNA QUANT-NO REFLEX-BLD: HIV-1 RNA Viral Load: 62

## 2012-04-14 ENCOUNTER — Other Ambulatory Visit: Payer: BC Managed Care – PPO

## 2012-04-14 DIAGNOSIS — B20 Human immunodeficiency virus [HIV] disease: Secondary | ICD-10-CM

## 2012-04-14 LAB — CBC WITH DIFFERENTIAL/PLATELET
Basophils Absolute: 0 10*3/uL (ref 0.0–0.1)
Basophils Relative: 0 % (ref 0–1)
Eosinophils Absolute: 0.1 10*3/uL (ref 0.0–0.7)
Eosinophils Relative: 2 % (ref 0–5)
Lymphocytes Relative: 34 % (ref 12–46)
MCHC: 33.2 g/dL (ref 30.0–36.0)
MCV: 91.5 fL (ref 78.0–100.0)
Monocytes Absolute: 0.8 10*3/uL (ref 0.1–1.0)
Platelets: 344 10*3/uL (ref 150–400)
RDW: 15.2 % (ref 11.5–15.5)
WBC: 6.4 10*3/uL (ref 4.0–10.5)

## 2012-04-15 LAB — COMPLETE METABOLIC PANEL WITH GFR
ALT: 229 U/L — ABNORMAL HIGH (ref 0–53)
AST: 119 U/L — ABNORMAL HIGH (ref 0–37)
Alkaline Phosphatase: 94 U/L (ref 39–117)
BUN: 13 mg/dL (ref 6–23)
Calcium: 8.5 mg/dL (ref 8.4–10.5)
Chloride: 104 mEq/L (ref 96–112)
Creat: 1.01 mg/dL (ref 0.50–1.35)
Potassium: 4.6 mEq/L (ref 3.5–5.3)

## 2012-04-15 LAB — T-HELPER CELL (CD4) - (RCID CLINIC ONLY)
CD4 % Helper T Cell: 33 % (ref 33–55)
CD4 T Cell Abs: 660 uL (ref 400–2700)

## 2012-04-28 ENCOUNTER — Ambulatory Visit (INDEPENDENT_AMBULATORY_CARE_PROVIDER_SITE_OTHER): Payer: BC Managed Care – PPO | Admitting: Internal Medicine

## 2012-04-28 ENCOUNTER — Other Ambulatory Visit: Payer: Self-pay | Admitting: *Deleted

## 2012-04-28 ENCOUNTER — Encounter: Payer: Self-pay | Admitting: Internal Medicine

## 2012-04-28 VITALS — BP 113/72 | HR 92 | Temp 97.9°F | Ht 70.0 in | Wt 145.0 lb

## 2012-04-28 DIAGNOSIS — Z23 Encounter for immunization: Secondary | ICD-10-CM

## 2012-04-28 DIAGNOSIS — R7401 Elevation of levels of liver transaminase levels: Secondary | ICD-10-CM

## 2012-04-28 DIAGNOSIS — Z21 Asymptomatic human immunodeficiency virus [HIV] infection status: Secondary | ICD-10-CM

## 2012-04-28 DIAGNOSIS — R7402 Elevation of levels of lactic acid dehydrogenase (LDH): Secondary | ICD-10-CM

## 2012-04-28 DIAGNOSIS — B20 Human immunodeficiency virus [HIV] disease: Secondary | ICD-10-CM

## 2012-04-28 MED ORDER — EMTRICITABINE-TENOFOVIR DF 200-300 MG PO TABS: 1.0000 | ORAL_TABLET | Freq: Every day | ORAL | Status: DC

## 2012-04-28 MED ORDER — RITONAVIR 100 MG PO TABS: 100.0000 mg | ORAL_TABLET | Freq: Every day | ORAL | Status: DC

## 2012-04-28 MED ORDER — DARUNAVIR ETHANOLATE 800 MG PO TABS: 800.0000 mg | ORAL_TABLET | Freq: Every day | ORAL | Status: DC

## 2012-04-28 NOTE — Assessment & Plan Note (Signed)
He has a mild elevation in his AST and ALT. I will recheck this next visit. Previously these were within normal limits. He is having no significant side effects and no additional medications or significant alcohol intake.

## 2012-04-28 NOTE — Addendum Note (Signed)
Addended by: Gardiner Barefoot on: 04/28/2012 04:17 PM   Modules accepted: Orders

## 2012-04-28 NOTE — Progress Notes (Signed)
  Subjective:    Patient ID: Erik Bautista, male    DOB: 1965/11/12, 46 y.o.   MRN: [id number]  HPI He comes in for follow up of his HIV. He was previously on a study with a regimen of persistent, Norvir and Truvada which I have continued. He has had no new issues since his last visit. He does report some missed doses particularly 3 and last week. He also tells me that he regularly travels to [place] and off and will be there for more than a month and runs out of his medications and so stopped his medications for more than a month at a time. He otherwise has had no significant issues.   Review of Systems  Constitutional: Negative for fever, chills, fatigue and unexpected weight change.  HENT: Negative for sore throat and trouble swallowing.   Respiratory: Negative for cough and shortness of breath.   Cardiovascular: Negative for chest pain, palpitations and leg swelling.  Gastrointestinal: Negative for nausea, abdominal pain and diarrhea.  Musculoskeletal: Negative for myalgias, joint swelling and arthralgias.  Skin: Negative for rash.  Neurological: Negative for dizziness and headaches.       Objective:   Physical Exam  Constitutional: He appears well-developed and well-nourished. No distress.  Cardiovascular: Normal rate, regular rhythm and normal heart sounds.  Exam reveals no gallop and no friction rub.   No murmur heard. Pulmonary/Chest: Effort normal and breath sounds normal. No respiratory distress. He has no wheezes. He has no rales.  Abdominal: Soft. Bowel sounds are normal. He exhibits no distension. There is no tenderness. There is no rebound.          Assessment & Plan:

## 2012-04-28 NOTE — Assessment & Plan Note (Signed)
I discussed with him at length, my concerns about missing doses. He is interested in a once daily or one pill option but I discussed with him I hesitate she did change him. His biggest concern is the co-pay. In regards to his co-pay, I will both give him a 90 day supply and also he is going to go to a different pharmacy where they have more experience with the co-pay cards. I also gave him a 90 day supply which hopefully will alleviate any compliance issues on his travels to [place]. I'm going to have him return in about 6 weeks for repeat labs to assure no resistance has developed and I will see him after that.  More than 40 minutes was spent with him including face-to-face contact and medication counseling by myself as well as the pharmacist.

## 2012-04-28 NOTE — Progress Notes (Signed)
HPI: Erik Bautista is a 46 y.o. male with HIV who is here for follow up.   Allergies: No Known Allergies  Vitals: Temp: 97.9 F (36.6 C) (11/26 1445) Temp src: Oral (11/26 1445) BP: 113/72 mmHg (11/26 1445) Pulse Rate: 92  (11/26 1445)  Past Medical History: Past Medical History  Diagnosis Date  . HIV infection   . Hyperlipidemia     Social History: History   Social History  . Marital Status: Married    Spouse Name: N/A    Number of Children: N/A  . Years of Education: N/A   Social History Main Topics  . Smoking status: Former Smoker    Quit date: 06/03/1988  . Smokeless tobacco: Never Used  . Alcohol Use: No  . Drug Use: No  . Sexually Active: Yes     Comment: pt. given condoms   Other Topics Concern  . None   Social History Narrative  . None    Home Medications:  (Not in a hospital admission)  Current Regimen: Prezista/r + Truvada  Labs: HIV 1 RNA Quant (copies/mL)  Date Value  04/14/2012 <20   07/19/2010 101   03/27/2010 39      HIV-1 RNA Viral Load (no units)  Date Value  12/10/2011 62   10/02/2011 59   06/20/2011 <40      CD4 T Cell Abs (cmm)  Date Value  04/14/2012 660   12/14/2007 400   08/31/2007 400      Hep B S Ab (no units)  Date Value  05/23/2008 POS*     Hepatitis B Surface Ag (no units)  Date Value  10/02/2011 NEGATIVE      HCV Ab (no units)  Date Value  10/02/2011 NEGATIVE     CrCl: Estimated Creatinine Clearance: 83.2 ml/min (by C-G formula based on Cr of 1.01).  Lipids:    Component Value Date/Time   CHOL 193 12/10/2011 0845   TRIG 71 12/10/2011 0845   HDL 65 12/10/2011 0845   CHOLHDL 3.0 12/10/2011 0845   VLDL 14 12/10/2011 0845   LDLCALC 114* 12/10/2011 0845    Assessment:  His VL is undetectable and CD4 is >500. I questioned him about his compliance. He stated that in the past 30 days he has missed about 3-4 doses. When asked why, he stated that he usually takes them before work (night shift) but sometimes he forgets  to take them. He asked whether he could take them when he gets home from work. I told him that since it's <12hr until his next dose, he should just wait til the next dose. He inquired about switching to a one daily tablet for copay reason. I advised him that since is not 100% compliance with his medications, he should stay with his boosted protease inhibitor regimen since it has better resistance tolerance. Upon further questioning by Dr. Luciana Axe, he said that sometimes he travels to [place] for months at a time and he would miss meds during that period. We decided to change his pharmacy to the [facility] since they are more familiar dealing with copay cards and dispense 90 days supply to him just in case he ever travels to [place] again. I really stress resistance could become a big issue if he cont to miss doses.   Recommendations: Cont DRVr + Truvada  Clide Cliff, PharmD Clinical Infectious Disease Pharmacist [facility] 04/28/2012, 10:26 PM

## 2012-06-15 ENCOUNTER — Ambulatory Visit (INDEPENDENT_AMBULATORY_CARE_PROVIDER_SITE_OTHER): Payer: BC Managed Care – PPO | Admitting: *Deleted

## 2012-06-15 ENCOUNTER — Other Ambulatory Visit: Payer: BC Managed Care – PPO

## 2012-06-15 ENCOUNTER — Encounter: Payer: Self-pay | Admitting: *Deleted

## 2012-06-15 VITALS — BP 109/71 | HR 79 | Temp 97.6°F | Ht 69.12 in | Wt 150.2 lb

## 2012-06-15 DIAGNOSIS — B20 Human immunodeficiency virus [HIV] disease: Secondary | ICD-10-CM

## 2012-06-15 LAB — COMPREHENSIVE METABOLIC PANEL
ALT: 150 U/L — ABNORMAL HIGH (ref 0–53)
AST: 74 U/L — ABNORMAL HIGH (ref 0–37)
Albumin: 4.5 g/dL (ref 3.5–5.2)
CO2: 26 mEq/L (ref 19–32)
Calcium: 9.3 mg/dL (ref 8.4–10.5)
Chloride: 102 mEq/L (ref 96–112)
Creat: 1.2 mg/dL (ref 0.50–1.35)
Potassium: 4.1 mEq/L (ref 3.5–5.3)
Sodium: 137 mEq/L (ref 135–145)
Total Protein: 7.3 g/dL (ref 6.0–8.3)

## 2012-06-15 LAB — HEPATITIS C ANTIBODY: HCV Ab: REACTIVE — AB

## 2012-06-15 LAB — CBC WITH DIFFERENTIAL/PLATELET
Basophils Absolute: 0 10*3/uL (ref 0.0–0.1)
HCT: 41.5 % (ref 39.0–52.0)
Lymphocytes Relative: 34 % (ref 12–46)
Neutro Abs: 3 10*3/uL (ref 1.7–7.7)
Neutrophils Relative %: 49 % (ref 43–77)
Platelets: 332 10*3/uL (ref 150–400)
RDW: 13.7 % (ref 11.5–15.5)
WBC: 6 10*3/uL (ref 4.0–10.5)

## 2012-06-15 LAB — LIPID PANEL
Cholesterol: 202 mg/dL — ABNORMAL HIGH (ref 0–200)
Total CHOL/HDL Ratio: 2.1 Ratio

## 2012-06-15 LAB — HEMOGLOBIN A1C: Mean Plasma Glucose: 97 mg/dL (ref <117)

## 2012-06-15 LAB — HEPATITIS B SURFACE ANTIGEN: Hepatitis B Surface Ag: NEGATIVE

## 2012-06-15 NOTE — Progress Notes (Signed)
Erik Bautista was here to enroll in the new study, HAILO on HIV and Aging which will last over 6 years. Informed consent was obtained according to our SOP guidelines. He feels like he is losing more weight but his weight is actually the same as it was last year at this time. He continues to report insomnia and has been having some left shoulder pain which he attributes to overuse at work. He says he has occasionally missed some of his daily doses of meds, and did admit to using OTC antacids quite a bit. We instructed him to not take them at the same time as his ARVS, at least two hours apart. He has a hx of fractures x 2 over the past 3 years and we discussed bone loss in HIV + patients and vitamin D supplementation. He says his dairy intake is high. He has an appt with Dr. Luciana Axe next month for followup and research in July.

## 2012-06-16 ENCOUNTER — Other Ambulatory Visit: Payer: Self-pay | Admitting: *Deleted

## 2012-06-16 DIAGNOSIS — B1710 Acute hepatitis C without hepatic coma: Secondary | ICD-10-CM

## 2012-06-16 LAB — HIV-1 RNA QUANT-NO REFLEX-BLD: HIV 1 RNA Quant: <20 copies/mL (ref <20)

## 2012-06-16 LAB — PROTEIN, URINE, RANDOM: Total Protein, Urine: 11 mg/dL

## 2012-06-16 LAB — CREATININE, URINE, RANDOM: Creatinine, Urine: 212.3 mg/dL

## 2012-06-16 NOTE — Progress Notes (Signed)
Patient's hep c antibody test is now positive, was negative in may of Last year. Discussed with Dr. Luciana Axe who wants hep C DNA and genotype drawn. Will call patient to come in for lab draw.

## 2012-06-17 ENCOUNTER — Other Ambulatory Visit (INDEPENDENT_AMBULATORY_CARE_PROVIDER_SITE_OTHER): Payer: BC Managed Care – PPO

## 2012-06-17 DIAGNOSIS — B1710 Acute hepatitis C without hepatic coma: Secondary | ICD-10-CM

## 2012-06-18 LAB — HEPATITIS C RNA QUANTITATIVE: HCV Quantitative: 435790 IU/mL — ABNORMAL HIGH (ref <15)

## 2012-06-19 LAB — HEPATITIS C GENOTYPE: HCV Genotype: 3

## 2012-06-22 ENCOUNTER — Other Ambulatory Visit: Payer: BC Managed Care – PPO

## 2012-07-06 ENCOUNTER — Encounter: Payer: Self-pay | Admitting: Internal Medicine

## 2012-07-06 ENCOUNTER — Ambulatory Visit (INDEPENDENT_AMBULATORY_CARE_PROVIDER_SITE_OTHER): Payer: BC Managed Care – PPO | Admitting: Internal Medicine

## 2012-07-06 VITALS — BP 115/62 | HR 69 | Temp 98.3°F | Ht 70.0 in | Wt 152.0 lb

## 2012-07-06 DIAGNOSIS — B1920 Unspecified viral hepatitis C without hepatic coma: Secondary | ICD-10-CM

## 2012-07-06 DIAGNOSIS — B20 Human immunodeficiency virus [HIV] disease: Secondary | ICD-10-CM

## 2012-07-06 NOTE — Assessment & Plan Note (Signed)
He does do well with his medications and has had no problems with running out with his travel now. He will continue with the study and can follow up with me

## 2012-07-06 NOTE — Progress Notes (Signed)
  Subjective:    Patient ID: Erik Bautista, male    DOB: November 12, 1965, 47 y.o.   MRN: [id number]  HPI He comes in for followup of his HIV and newly diagnosed hepatitis C. He continues to take his medicine for HIV and is undetectable. He has also started on a 6 year study and we'll continue to follow up with study coordinator. For his hepatitis C, he has genotype 3 with active virus. No particular risk factors identified. He was last tested in May of 2013. He is otherwise asymptomatic. He is interested in treatment.   Review of Systems  HENT: Negative for sore throat and trouble swallowing.   Cardiovascular: Negative for leg swelling.  Gastrointestinal: Negative for nausea, abdominal pain, diarrhea and abdominal distention.  Neurological: Negative for dizziness and headaches.       Objective:   Physical Exam  Constitutional: He appears well-developed and well-nourished. No distress.  Cardiovascular: Normal rate, regular rhythm and normal heart sounds.  Exam reveals no gallop and no friction rub.   No murmur heard. Pulmonary/Chest: Breath sounds normal. No respiratory distress. He has no wheezes.          Assessment & Plan:

## 2012-07-06 NOTE — Assessment & Plan Note (Signed)
He does have new hepatitis C antibody positive with a positive viral load and genotype 3. I did discuss with him that there are treatment options. He will be referred to the [facility] hepatology clinic here in [place].

## 2012-07-08 ENCOUNTER — Telehealth: Payer: Self-pay | Admitting: *Deleted

## 2012-07-08 NOTE — Telephone Encounter (Signed)
Faxed referral form and patient information to [facility] at [phone]. Andree Coss, RN

## 2012-12-07 ENCOUNTER — Ambulatory Visit (INDEPENDENT_AMBULATORY_CARE_PROVIDER_SITE_OTHER): Payer: BC Managed Care – PPO | Admitting: *Deleted

## 2012-12-07 ENCOUNTER — Other Ambulatory Visit: Payer: BC Managed Care – PPO

## 2012-12-07 VITALS — BP 117/75 | HR 56 | Temp 97.6°F | Wt 158.0 lb

## 2012-12-07 DIAGNOSIS — Z21 Asymptomatic human immunodeficiency virus [HIV] infection status: Secondary | ICD-10-CM

## 2012-12-07 DIAGNOSIS — B20 Human immunodeficiency virus [HIV] disease: Secondary | ICD-10-CM

## 2012-12-07 NOTE — Progress Notes (Signed)
Patient here for week 24 study visit. He has been to see the hepatologist, but has not started on treatment yet. He says he goes back in August and hopefully will go on study then. There is a study coming for Hep C  in the co-infected that should open by the end of the year that he may be eligible for if he hasn't already begun treatment. He denies any other problems currently. He is concerned somewhat about falling and having another fracture. I recommended that he take extra vitamin D and calcium to help strengthen his bones. He will return in December for the next study visit.

## 2012-12-07 NOTE — Addendum Note (Signed)
Addended by: Nicolasa Ducking on: 12/07/2012 12:31 PM   Modules accepted: Orders

## 2012-12-08 LAB — HIV-1 RNA QUANT-NO REFLEX-BLD: HIV-1 RNA Quant, Log: <1.3 {Log} (ref <1.30)

## 2012-12-08 LAB — T-HELPER CELL (CD4) - (RCID CLINIC ONLY): CD4 T Cell Abs: 1030 uL (ref 400–2700)

## 2013-02-12 ENCOUNTER — Other Ambulatory Visit: Payer: Self-pay | Admitting: Internal Medicine

## 2013-02-12 DIAGNOSIS — B20 Human immunodeficiency virus [HIV] disease: Secondary | ICD-10-CM

## 2013-04-08 ENCOUNTER — Other Ambulatory Visit: Payer: Self-pay

## 2013-04-13 ENCOUNTER — Telehealth: Payer: Self-pay | Admitting: *Deleted

## 2013-04-13 NOTE — Telephone Encounter (Signed)
Requested pt call [facility] for appts.

## 2013-04-21 ENCOUNTER — Encounter: Payer: Self-pay | Admitting: Internal Medicine

## 2013-04-21 ENCOUNTER — Ambulatory Visit (INDEPENDENT_AMBULATORY_CARE_PROVIDER_SITE_OTHER): Payer: BC Managed Care – PPO | Admitting: Internal Medicine

## 2013-04-21 ENCOUNTER — Other Ambulatory Visit: Payer: Self-pay | Admitting: Internal Medicine

## 2013-04-21 VITALS — BP 130/85 | HR 78 | Temp 97.5°F | Ht 70.0 in | Wt 141.0 lb

## 2013-04-21 DIAGNOSIS — R7401 Elevation of levels of liver transaminase levels: Secondary | ICD-10-CM

## 2013-04-21 DIAGNOSIS — B20 Human immunodeficiency virus [HIV] disease: Secondary | ICD-10-CM

## 2013-04-21 DIAGNOSIS — B1920 Unspecified viral hepatitis C without hepatic coma: Secondary | ICD-10-CM

## 2013-04-21 DIAGNOSIS — Z23 Encounter for immunization: Secondary | ICD-10-CM

## 2013-04-21 MED ORDER — RITONAVIR 100 MG PO TABS: ORAL_TABLET | ORAL | Status: DC

## 2013-04-21 MED ORDER — DARUNAVIR ETHANOLATE 800 MG PO TABS: 800.0000 mg | ORAL_TABLET | Freq: Every day | ORAL | Status: DC

## 2013-04-21 MED ORDER — EMTRICITABINE-TENOFOVIR DF 200-300 MG PO TABS: 1.0000 | ORAL_TABLET | Freq: Every day | ORAL | Status: DC

## 2013-04-21 NOTE — Addendum Note (Signed)
Addended by: Gardiner Barefoot on: 04/21/2013 03:28 PM   Modules accepted: Orders

## 2013-04-21 NOTE — Assessment & Plan Note (Signed)
Check his LFTs again I suspect this is related to his hepatitis C

## 2013-04-21 NOTE — Progress Notes (Signed)
  Subjective:    Patient ID: Erik Bautista, male    DOB: 04-19-1966, 47 y.o.   MRN: [id number]  HPI  He comes in for followup of his HIV. He continues to take his medicine for HIV and is undetectable last visit. He has also started on a 6 year study and we'll continue to follow up with study coordinator. For his hepatitis C, he has genotype 3 with active virus and is going to get treatment from the hepatology clinic. No particular risk factors identified. He was last tested in May of 2013. No recent labs.  He is due to labs with the study next month.     Review of Systems  HENT: Negative for sore throat and trouble swallowing.   Cardiovascular: Negative for leg swelling.  Gastrointestinal: Negative for nausea, abdominal pain, diarrhea and abdominal distention.  Neurological: Negative for dizziness and headaches.       Objective:   Physical Exam  Constitutional: He appears well-developed and well-nourished. No distress.  Cardiovascular: Normal rate, regular rhythm and normal heart sounds.  Exam reveals no gallop and no friction rub.   No murmur heard. Pulmonary/Chest: Breath sounds normal. No respiratory distress. He has no wheezes.          Assessment & Plan:

## 2013-04-21 NOTE — Assessment & Plan Note (Addendum)
He does report compliance we'll check his labs to assure he remains undetectable.  A return about 4 or 5 months, sooner if indicated

## 2013-04-21 NOTE — Assessment & Plan Note (Signed)
He is supposed to get started on treatment soon though he is unsure of exactly when or what the medications will be.

## 2013-05-24 ENCOUNTER — Other Ambulatory Visit: Payer: BC Managed Care – PPO

## 2013-05-24 ENCOUNTER — Ambulatory Visit (INDEPENDENT_AMBULATORY_CARE_PROVIDER_SITE_OTHER): Payer: Self-pay | Admitting: *Deleted

## 2013-05-24 VITALS — BP 109/69 | HR 72 | Temp 98.0°F | Ht 68.5 in | Wt 146.2 lb

## 2013-05-24 DIAGNOSIS — Z21 Asymptomatic human immunodeficiency virus [HIV] infection status: Secondary | ICD-10-CM

## 2013-05-24 DIAGNOSIS — B20 Human immunodeficiency virus [HIV] disease: Secondary | ICD-10-CM

## 2013-05-24 LAB — CREATININE, URINE, RANDOM: Creatinine, Urine: 100.7 mg/dL

## 2013-05-24 LAB — COMPREHENSIVE METABOLIC PANEL
ALT: 14 U/L (ref 0–53)
AST: 17 U/L (ref 0–37)
Albumin: 4.3 g/dL (ref 3.5–5.2)
BUN: 15 mg/dL (ref 6–23)
Calcium: 9.3 mg/dL (ref 8.4–10.5)
Chloride: 103 mEq/L (ref 96–112)
Glucose, Bld: 96 mg/dL (ref 70–99)
Potassium: 4.1 mEq/L (ref 3.5–5.3)
Sodium: 139 mEq/L (ref 135–145)

## 2013-05-24 LAB — CBC WITH DIFFERENTIAL/PLATELET
Basophils Absolute: 0 10*3/uL (ref 0.0–0.1)
Basophils Relative: 0 % (ref 0–1)
Eosinophils Absolute: 0.1 10*3/uL (ref 0.0–0.7)
Eosinophils Relative: 2 % (ref 0–5)
Lymphs Abs: 2.3 10*3/uL (ref 0.7–4.0)
MCH: 30.5 pg (ref 26.0–34.0)
MCHC: 33.8 g/dL (ref 30.0–36.0)
MCV: 90.2 fL (ref 78.0–100.0)
Neutrophils Relative %: 62 % (ref 43–77)
Platelets: 395 10*3/uL (ref 150–400)
RDW: 13.6 % (ref 11.5–15.5)

## 2013-05-24 LAB — LIPID PANEL
HDL: 88 mg/dL (ref >39)
Triglycerides: 123 mg/dL (ref <150)

## 2013-05-24 LAB — RPR: RPR Ser Ql: REACTIVE — AB

## 2013-05-24 LAB — PROTEIN, URINE, RANDOM: Total Protein, Urine: 7 mg/dL

## 2013-05-24 LAB — HEMOGLOBIN A1C: Hgb A1c MFr Bld: 5.1 % (ref <5.7)

## 2013-05-24 LAB — RPR TITER: RPR Titer: 1:4 {titer}

## 2013-05-24 NOTE — Progress Notes (Signed)
Patient here for week 48 A5322 study visit. He says he  started treatment for his Hep C with Sovaldi and Ribasphere around the first of December. He has not noticed any side effects from the new medication. He denies any new problems or concerns except for insomnia attributed to his fluctuating work schedule. His weight is down 4 pounds from this time last year. He will return in 6 months for the next visit.

## 2013-05-25 ENCOUNTER — Encounter: Payer: Self-pay | Admitting: *Deleted

## 2013-05-25 ENCOUNTER — Telehealth: Payer: Self-pay | Admitting: *Deleted

## 2013-05-25 LAB — T.PALLIDUM AB, TOTAL: T pallidum Antibodies (TP-PA): >8 S/CO — ABNORMAL HIGH (ref <0.90)

## 2013-05-25 NOTE — Telephone Encounter (Addendum)
Called patient to give him the positive RPR results. There was a generic message on his voicemail, so I did not leave a message.  I sent a mychart message asking him to call the office in regards to his recent lab work.   Message from Dr. Luciana Axe below: "He has a positive syphilis test on his routine labs. He will need Bicillin x 3 weeks. thanks "

## 2013-05-26 LAB — T-HELPER CELL (CD4) - (RCID CLINIC ONLY): CD4 T Cell Abs: 990 /uL (ref 400–2700)

## 2013-05-26 LAB — HIV-1 RNA QUANT-NO REFLEX-BLD
HIV 1 RNA Quant: <20 copies/mL (ref <20)
HIV-1 RNA Quant, Log: <1.3 {Log} (ref <1.30)

## 2013-05-31 ENCOUNTER — Ambulatory Visit (INDEPENDENT_AMBULATORY_CARE_PROVIDER_SITE_OTHER): Payer: BC Managed Care – PPO | Admitting: *Deleted

## 2013-05-31 DIAGNOSIS — A539 Syphilis, unspecified: Secondary | ICD-10-CM

## 2013-05-31 MED ORDER — PENICILLIN G BENZATHINE 1200000 UNIT/2ML IM SUSP
1.2000 10*6.[IU] | Freq: Once | INTRAMUSCULAR | Status: AC
  Administered 2013-05-31: 1.2 10*6.[IU] via INTRAMUSCULAR

## 2013-06-08 ENCOUNTER — Ambulatory Visit (INDEPENDENT_AMBULATORY_CARE_PROVIDER_SITE_OTHER): Payer: BC Managed Care – PPO | Admitting: *Deleted

## 2013-06-08 DIAGNOSIS — A539 Syphilis, unspecified: Secondary | ICD-10-CM

## 2013-06-08 MED ORDER — PENICILLIN G BENZATHINE 1200000 UNIT/2ML IM SUSP
1.2000 10*6.[IU] | Freq: Once | INTRAMUSCULAR | Status: AC
  Administered 2013-06-08: 1.2 10*6.[IU] via INTRAMUSCULAR

## 2013-06-14 ENCOUNTER — Ambulatory Visit: Payer: BC Managed Care – PPO

## 2013-06-17 ENCOUNTER — Telehealth: Payer: Self-pay | Admitting: *Deleted

## 2013-06-17 ENCOUNTER — Ambulatory Visit (INDEPENDENT_AMBULATORY_CARE_PROVIDER_SITE_OTHER): Payer: BC Managed Care – PPO | Admitting: *Deleted

## 2013-06-17 DIAGNOSIS — A539 Syphilis, unspecified: Secondary | ICD-10-CM

## 2013-06-17 MED ORDER — PENICILLIN G BENZATHINE 1200000 UNIT/2ML IM SUSP
1.2000 10*6.[IU] | Freq: Once | INTRAMUSCULAR | Status: AC
  Administered 2013-06-17: 1.2 10*6.[IU] via INTRAMUSCULAR

## 2013-06-17 NOTE — Telephone Encounter (Signed)
Spoke with patient.  He is feeling much better after this morning's procedure. Landis Gandy, RN

## 2013-06-17 NOTE — Progress Notes (Signed)
Patient passed out after 2nd shot.  RN assisted him to a chair where he passed out again.  RN kept him safe while he regained consciousness.  Patient reported feeling dizzy, hot.  Dr. Linus Salmons came to assess, patient assisted to bed.  BP, pulse assessed (81/51 40; 94/60 60; 97/61 67), returned to normal after 10 minutes of lying down (114/74 45).  Patient given coke, waited in lobby for 15 minutes.  RN assessed again, vital signs stable (110/64 71).  Patient states he feels normal, given clearance to go home, he states he will call a friend to drive him home to be safe.   Landis Gandy, RN

## 2013-10-30 ENCOUNTER — Other Ambulatory Visit: Payer: Self-pay | Admitting: Internal Medicine

## 2013-11-01 ENCOUNTER — Telehealth: Payer: Self-pay | Admitting: *Deleted

## 2013-11-01 ENCOUNTER — Ambulatory Visit (INDEPENDENT_AMBULATORY_CARE_PROVIDER_SITE_OTHER): Payer: Self-pay | Admitting: *Deleted

## 2013-11-01 ENCOUNTER — Other Ambulatory Visit: Payer: BC Managed Care – PPO

## 2013-11-01 VITALS — BP 124/83 | HR 63 | Temp 97.9°F | Resp 16 | Wt 152.0 lb

## 2013-11-01 DIAGNOSIS — B20 Human immunodeficiency virus [HIV] disease: Secondary | ICD-10-CM

## 2013-11-01 DIAGNOSIS — Z21 Asymptomatic human immunodeficiency virus [HIV] infection status: Secondary | ICD-10-CM

## 2013-11-01 MED ORDER — DARUNAVIR ETHANOLATE 800 MG PO TABS: 800.0000 mg | ORAL_TABLET | Freq: Every day | ORAL | Status: DC

## 2013-11-01 MED ORDER — EMTRICITABINE-TENOFOVIR DF 200-300 MG PO TABS: 1.0000 | ORAL_TABLET | Freq: Every day | ORAL | Status: DC

## 2013-11-01 MED ORDER — RITONAVIR 100 MG PO TABS: ORAL_TABLET | ORAL | Status: DC

## 2013-11-01 NOTE — Progress Notes (Signed)
Arlis was here for his week 23 [place] visit. He says he finished the 24 weeks of Hep C treatment last week and needs to schedule a follow-up appointment with his hepatologist. He say he has been feeling a little depressed and anxious the past 2 months. A coworker died suddenly and it got him thinking about what would happen to him, if something like that happened here. He has no family or close friends in [place]. I offered him counseling with [facility] or [facility] but he declined. I also gave him the contact info for [facility] for advice about preplanning funerals, living will, etc. I scheduled him an appt to see Dr. Linus Salmons next week. We obtained a viral load today but the result will probably not be back before his visit.

## 2013-11-01 NOTE — Telephone Encounter (Signed)
Refills.  Upcoming appt w/ Dr. Linus Salmons.

## 2013-11-01 NOTE — Addendum Note (Signed)
Addended by: Bobbie Stack on: 11/01/2013 08:16 AM   Modules accepted: Orders, Medications

## 2013-11-02 LAB — T-HELPER CELL (CD4) - (RCID CLINIC ONLY)
CD4 T CELL ABS: 910 /uL (ref 400–2700)
CD4 T CELL HELPER: 43 % (ref 33–55)

## 2013-11-10 ENCOUNTER — Encounter: Payer: Self-pay | Admitting: Internal Medicine

## 2013-11-10 ENCOUNTER — Ambulatory Visit (INDEPENDENT_AMBULATORY_CARE_PROVIDER_SITE_OTHER): Payer: BC Managed Care – PPO | Admitting: Internal Medicine

## 2013-11-10 VITALS — BP 122/78 | HR 80 | Temp 98.2°F | Ht 69.69 in | Wt 145.0 lb

## 2013-11-10 DIAGNOSIS — B1920 Unspecified viral hepatitis C without hepatic coma: Secondary | ICD-10-CM

## 2013-11-10 DIAGNOSIS — B20 Human immunodeficiency virus [HIV] disease: Secondary | ICD-10-CM

## 2013-11-10 NOTE — Assessment & Plan Note (Addendum)
on treatment for genotype 3.

## 2013-11-10 NOTE — Assessment & Plan Note (Signed)
Doing well with HIV. RTC 6 months.  Will watch for viral load.    For advanced directive, he was given information on the process and to go to spiritual services at the hospital for the free service to do his advanced directive.

## 2013-11-10 NOTE — Progress Notes (Signed)
  Subjective:    Patient ID: Erik Bautista, male    DOB: 01-Mar-1966, 48 y.o.   MRN: [id number]  HPI  He comes in for followup of his HIV. He continues to take his medicine for HIV and is undetectable last visit. He has also started on a 6 year study and we'll continue to follow up with study coordinator. For his hepatitis C, he has genotype 3 with active virus and on treatment with ribavirin and sofosbuvir.  He was treated for syphilis as well after his last visit with me.     He also is concerned with putting together an advanced directive.  He recently witnessed a coworker die and has been concerned with his own death and who would make decisions for him in the event he is ill, particularly in the event he doesn't die.  He has been depressed due to that.     Review of Systems  HENT: Negative for sore throat and trouble swallowing.   Cardiovascular: Negative for leg swelling.  Gastrointestinal: Negative for nausea, abdominal pain, diarrhea and abdominal distention.  Neurological: Negative for dizziness and headaches.       Objective:   Physical Exam  Constitutional: He appears well-developed and well-nourished. No distress.  Cardiovascular: Normal rate, regular rhythm and normal heart sounds.  Exam reveals no gallop and no friction rub.   No murmur heard. Pulmonary/Chest: Breath sounds normal. No respiratory distress. He has no wheezes.          Assessment & Plan:

## 2013-11-15 ENCOUNTER — Encounter: Payer: Self-pay | Admitting: Internal Medicine

## 2013-11-15 LAB — HIV-1 RNA QUANT-NO REFLEX-BLD: HIV-1 RNA Viral Load: <40

## 2013-11-16 ENCOUNTER — Other Ambulatory Visit: Payer: Self-pay | Admitting: Nurse Practitioner

## 2013-11-16 DIAGNOSIS — C220 Liver cell carcinoma: Secondary | ICD-10-CM

## 2013-11-22 ENCOUNTER — Ambulatory Visit
Admission: RE | Admit: 2013-11-22 | Discharge: 2013-11-22 | Disposition: A | Discharge status: Home / Self Care | Payer: BC Managed Care – PPO | Source: Ambulatory Visit | Attending: Nurse Practitioner | Admitting: Nurse Practitioner

## 2013-11-22 DIAGNOSIS — C220 Liver cell carcinoma: Secondary | ICD-10-CM

## 2013-12-07 ENCOUNTER — Encounter: Payer: Self-pay | Admitting: Internal Medicine

## 2014-02-08 ENCOUNTER — Other Ambulatory Visit: Payer: Self-pay | Admitting: Internal Medicine

## 2014-04-05 ENCOUNTER — Ambulatory Visit (INDEPENDENT_AMBULATORY_CARE_PROVIDER_SITE_OTHER): Payer: BC Managed Care – PPO | Admitting: *Deleted

## 2014-04-05 VITALS — BP 121/75 | HR 78 | Temp 98.2°F | Resp 16 | Ht 68.5 in | Wt 146.5 lb

## 2014-04-05 DIAGNOSIS — B182 Chronic viral hepatitis C: Secondary | ICD-10-CM

## 2014-04-05 DIAGNOSIS — Z006 Encounter for examination for normal comparison and control in clinical research program: Secondary | ICD-10-CM

## 2014-04-05 DIAGNOSIS — B20 Human immunodeficiency virus [HIV] disease: Secondary | ICD-10-CM

## 2014-04-05 LAB — CBC WITH DIFFERENTIAL/PLATELET
BASOS ABS: 0 10*3/uL (ref 0.0–0.1)
Basophils Relative: 0 % (ref 0–1)
Eosinophils Absolute: 0.1 10*3/uL (ref 0.0–0.7)
Eosinophils Relative: 2 % (ref 0–5)
HCT: 44 % (ref 39.0–52.0)
HEMOGLOBIN: 14.9 g/dL (ref 13.0–17.0)
Lymphocytes Relative: 42 % (ref 12–46)
Lymphs Abs: 2.8 10*3/uL (ref 0.7–4.0)
MCH: 30 pg (ref 26.0–34.0)
MCHC: 33.9 g/dL (ref 30.0–36.0)
MCV: 88.5 fL (ref 78.0–100.0)
Monocytes Absolute: 0.9 10*3/uL (ref 0.1–1.0)
Monocytes Relative: 14 % — ABNORMAL HIGH (ref 3–12)
NEUTROS ABS: 2.8 10*3/uL (ref 1.7–7.7)
NEUTROS PCT: 42 % — AB (ref 43–77)
Platelets: 342 10*3/uL (ref 150–400)
RBC: 4.97 MIL/uL (ref 4.22–5.81)
RDW: 13.9 % (ref 11.5–15.5)
WBC: 6.6 10*3/uL (ref 4.0–10.5)

## 2014-04-05 LAB — COMPREHENSIVE METABOLIC PANEL
ALBUMIN: 5.4 g/dL — AB (ref 3.5–5.2)
ALT: 20 U/L (ref 0–53)
AST: 32 U/L (ref 0–37)
Alkaline Phosphatase: 97 U/L (ref 39–117)
BUN: 22 mg/dL (ref 6–23)
CALCIUM: 10 mg/dL (ref 8.4–10.5)
CHLORIDE: 98 meq/L (ref 96–112)
CO2: 27 mEq/L (ref 19–32)
Creat: 1.33 mg/dL (ref 0.50–1.35)
Glucose, Bld: 50 mg/dL — ABNORMAL LOW (ref 70–99)
POTASSIUM: 3.8 meq/L (ref 3.5–5.3)
Sodium: 137 mEq/L (ref 135–145)
Total Bilirubin: 1.1 mg/dL (ref 0.2–1.2)
Total Protein: 8.5 g/dL — ABNORMAL HIGH (ref 6.0–8.3)

## 2014-04-05 LAB — LIPID PANEL
CHOLESTEROL: 253 mg/dL — AB (ref 0–200)
HDL: 96 mg/dL (ref >39)
LDL Cholesterol: 137 mg/dL — ABNORMAL HIGH (ref 0–99)
TRIGLYCERIDES: 100 mg/dL (ref <150)
Total CHOL/HDL Ratio: 2.6 Ratio
VLDL: 20 mg/dL (ref 0–40)

## 2014-04-05 LAB — HEMOGLOBIN A1C
Hgb A1c MFr Bld: 5.1 % (ref <5.7)
MEAN PLASMA GLUCOSE: 100 mg/dL (ref <117)

## 2014-04-05 NOTE — Progress Notes (Addendum)
Erik Bautista is here for week 98 of the [place]. He denies any new problems but does admit to being somewhat anxious and depressed. He has no partner or family nearby. I referred him to counseling with [facility] and he has his card if he feels the need for help. When asked about exercising he says he is scared to do anything very physical because he is afraid of being hurt. He had 2 fractures from injuries in the past 5 years after he started his ARVs and is concerned that something like that will happen again when he has no one to help him here. He had finished his Hep C treatment in May but has not had good followup since. We did get a Hep C RNA drawn today and he is to follow up with Dr. Linus Salmons in a few weeks. HIV VL and CD4 were obtained today also on study. [name] said he feels like he has trouble concentrating at work and is unable to stay on task.

## 2014-04-06 LAB — CREATININE, URINE, RANDOM: Creatinine, Urine: 214 mg/dL

## 2014-04-06 LAB — T-HELPER CELL (CD4) - (RCID CLINIC ONLY)
CD4 % Helper T Cell: 40 % (ref 33–55)
CD4 T CELL ABS: 1100 /uL (ref 400–2700)

## 2014-04-06 LAB — PROTEIN, URINE, RANDOM: Total Protein, Urine: 24 mg/dL (ref 5–25)

## 2014-04-09 LAB — HEPATITIS C RNA QUANTITATIVE: HCV Quantitative: NOT DETECTED IU/mL (ref <15)

## 2014-04-10 ENCOUNTER — Other Ambulatory Visit: Payer: Self-pay | Admitting: Internal Medicine

## 2014-05-03 ENCOUNTER — Encounter: Payer: Self-pay | Admitting: Internal Medicine

## 2014-05-03 LAB — HIV-1 RNA QUANT-NO REFLEX-BLD: HIV-1 RNA Viral Load: <40

## 2014-05-04 ENCOUNTER — Ambulatory Visit (INDEPENDENT_AMBULATORY_CARE_PROVIDER_SITE_OTHER): Payer: BC Managed Care – PPO | Admitting: Internal Medicine

## 2014-05-04 ENCOUNTER — Encounter: Payer: Self-pay | Admitting: Internal Medicine

## 2014-05-04 VITALS — BP 129/83 | HR 79 | Temp 97.7°F | Ht 70.0 in | Wt 155.0 lb

## 2014-05-04 DIAGNOSIS — B182 Chronic viral hepatitis C: Secondary | ICD-10-CM | POA: Diagnosis not present

## 2014-05-04 DIAGNOSIS — B20 Human immunodeficiency virus [HIV] disease: Secondary | ICD-10-CM | POA: Diagnosis not present

## 2014-05-04 NOTE — Assessment & Plan Note (Addendum)
Now cured.  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

## 2014-05-04 NOTE — Progress Notes (Signed)
  Subjective:    Patient ID: Erik Bautista, male    DOB: 11/27/1965, 48 y.o.   MRN: [id number]  HPI He comes in for followup of his HIV and hep C. He continues to take his medicine for HIV and is undetectable last visit. He has also started on a 6 year study and we'll continue to follow up with study coordinator. For his hepatitis C, he has genotype 3 with active virus and on treatment with ribavirin and sofosbuvir which he finished he thinks in August.    His HCV viral load SVR 12 is undetectable.  (he is going to check to be sure it is SVR12).      Review of Systems  HENT: Negative for sore throat and trouble swallowing.   Cardiovascular: Negative for leg swelling.  Gastrointestinal: Negative for nausea, abdominal pain, diarrhea and abdominal distention.  Neurological: Negative for dizziness and headaches.       Objective:   Physical Exam  Constitutional: He appears well-developed and well-nourished. No distress.  HENT:  Mouth/Throat: No oropharyngeal exudate.  Eyes: No scleral icterus.  Cardiovascular: Normal rate, regular rhythm and normal heart sounds.   No murmur heard. Pulmonary/Chest: Effort normal and breath sounds normal. No respiratory distress.  Lymphadenopathy:    He has no cervical adenopathy.  Skin: No rash noted.          Assessment & Plan:

## 2014-05-04 NOTE — Assessment & Plan Note (Signed)
Doing well.  Followed by the study coordinator.  Can rtc 6 months with me.

## 2014-05-09 ENCOUNTER — Other Ambulatory Visit: Payer: Self-pay | Admitting: Internal Medicine

## 2014-05-09 DIAGNOSIS — B20 Human immunodeficiency virus [HIV] disease: Secondary | ICD-10-CM

## 2014-05-24 ENCOUNTER — Encounter: Payer: Self-pay | Admitting: Internal Medicine

## 2014-08-06 ENCOUNTER — Other Ambulatory Visit: Payer: Self-pay | Admitting: Internal Medicine

## 2014-08-15 ENCOUNTER — Other Ambulatory Visit: Payer: Self-pay | Admitting: *Deleted

## 2014-08-15 DIAGNOSIS — Z113 Encounter for screening for infections with a predominantly sexual mode of transmission: Secondary | ICD-10-CM

## 2014-10-11 ENCOUNTER — Encounter (INDEPENDENT_AMBULATORY_CARE_PROVIDER_SITE_OTHER): Payer: BC Managed Care – PPO | Admitting: *Deleted

## 2014-10-11 VITALS — BP 114/73 | HR 71 | Temp 97.3°F | Resp 16 | Wt 158.2 lb

## 2014-10-11 DIAGNOSIS — Z006 Encounter for examination for normal comparison and control in clinical research program: Secondary | ICD-10-CM

## 2014-10-11 LAB — HIV-1 RNA QUANT-NO REFLEX-BLD: HIV-1 RNA Viral Load: 40

## 2014-10-11 NOTE — Progress Notes (Signed)
Erik Bautista is here for his week 120 A5322 study visit. He denies any new problems. He says that he still gets depressed at times and feels anxious. He does not have any real support group. I told him about counseling offered here at the clinic. He is to call when he is feeling down so that he can see someone when he is feeling that way. It says that it has never stopped him from going to work but he does find himself isolating from others and not doing anything at home. And he is concerned that that is his "normal", but he says he didn't used to be that way. He will return in 6 months for the next study visit.

## 2014-11-04 ENCOUNTER — Encounter: Payer: Self-pay | Admitting: Internal Medicine

## 2014-11-13 ENCOUNTER — Other Ambulatory Visit: Payer: Self-pay | Admitting: Internal Medicine

## 2015-01-11 ENCOUNTER — Other Ambulatory Visit: Payer: Self-pay | Admitting: Internal Medicine

## 2015-01-14 ENCOUNTER — Other Ambulatory Visit: Payer: Self-pay | Admitting: Infectious Diseases

## 2015-01-14 DIAGNOSIS — B20 Human immunodeficiency virus [HIV] disease: Secondary | ICD-10-CM

## 2015-02-16 ENCOUNTER — Other Ambulatory Visit: Payer: Self-pay | Admitting: Internal Medicine

## 2015-03-06 ENCOUNTER — Encounter (INDEPENDENT_AMBULATORY_CARE_PROVIDER_SITE_OTHER): Payer: Self-pay | Admitting: *Deleted

## 2015-03-06 VITALS — BP 111/74 | HR 64 | Temp 98.2°F | Resp 16 | Ht 69.0 in | Wt 148.5 lb

## 2015-03-06 DIAGNOSIS — Z006 Encounter for examination for normal comparison and control in clinical research program: Secondary | ICD-10-CM

## 2015-03-06 LAB — COMPREHENSIVE METABOLIC PANEL
ALT: 11 U/L (ref 9–46)
AST: 13 U/L (ref 10–35)
Albumin: 4.2 g/dL (ref 3.6–5.1)
Alkaline Phosphatase: 92 U/L (ref 40–115)
BUN: 17 mg/dL (ref 7–25)
CO2: 28 mmol/L (ref 20–31)
Calcium: 9.5 mg/dL (ref 8.6–10.3)
Chloride: 102 mmol/L (ref 98–110)
Creat: 1.12 mg/dL (ref 0.70–1.33)
GLUCOSE: 105 mg/dL — AB (ref 65–99)
Potassium: 4.3 mmol/L (ref 3.5–5.3)
Sodium: 137 mmol/L (ref 135–146)
Total Bilirubin: 0.3 mg/dL (ref 0.2–1.2)
Total Protein: 7.1 g/dL (ref 6.1–8.1)

## 2015-03-06 LAB — LIPID PANEL
Cholesterol: 175 mg/dL (ref 125–200)
HDL: 62 mg/dL (ref >=40)
LDL Cholesterol: 70 mg/dL (ref <130)
Total CHOL/HDL Ratio: 2.8 Ratio (ref <=5.0)
Triglycerides: 213 mg/dL — ABNORMAL HIGH (ref <150)
VLDL: 43 mg/dL — ABNORMAL HIGH (ref <30)

## 2015-03-06 LAB — HEMOGLOBIN A1C
HEMOGLOBIN A1C: 5.4 % (ref <5.7)
Mean Plasma Glucose: 108 mg/dL (ref <117)

## 2015-03-06 NOTE — Progress Notes (Signed)
Erik Bautista is here for his week 144 study visit for The HAILO Study: A Long Term follow-up of Older HIV-Infected Adults in the ACTG, an observational study addressing the issues of aging, HIV infection and Inflammation. He denies any new problems or concerns but continues to mention that he still feels like his memory is getting worse and he has trouble concentrating. He says his sleep is better and he doesn't feel quite as depressed. I told him about study [phone] which is looking to see if adding tivicay or maraviroc to his regimen would help improve neurologic function. He is interested and we plan to screen him in 2 weeks. He will return for this study in 24 weeks. I did schedule an appointment for him with Dr. Linus Salmons the first of December.

## 2015-03-07 LAB — PROTEIN, URINE, RANDOM: Total Protein, Urine: 10 mg/dL (ref 5–25)

## 2015-03-07 LAB — CREATININE, URINE, RANDOM: Creatinine, Urine: 113.2 mg/dL

## 2015-03-13 ENCOUNTER — Other Ambulatory Visit: Payer: Self-pay | Admitting: Internal Medicine

## 2015-03-22 ENCOUNTER — Encounter: Payer: Self-pay | Admitting: Internal Medicine

## 2015-03-22 ENCOUNTER — Encounter (INDEPENDENT_AMBULATORY_CARE_PROVIDER_SITE_OTHER): Payer: Self-pay | Admitting: *Deleted

## 2015-03-22 VITALS — BP 136/81 | HR 76 | Temp 98.2°F | Resp 16 | Ht 68.75 in | Wt 153.0 lb

## 2015-03-22 DIAGNOSIS — Z006 Encounter for examination for normal comparison and control in clinical research program: Secondary | ICD-10-CM

## 2015-03-22 LAB — CBC WITH DIFFERENTIAL/PLATELET
BASOS ABS: 0 10*3/uL (ref 0.0–0.1)
Basophils Relative: 0 % (ref 0–1)
EOS ABS: 0.1 10*3/uL (ref 0.0–0.7)
Eosinophils Relative: 1 % (ref 0–5)
HEMATOCRIT: 41.2 % (ref 39.0–52.0)
Hemoglobin: 13.7 g/dL (ref 13.0–17.0)
LYMPHS ABS: 1.7 10*3/uL (ref 0.7–4.0)
LYMPHS PCT: 29 % (ref 12–46)
MCH: 29.1 pg (ref 26.0–34.0)
MCHC: 33.3 g/dL (ref 30.0–36.0)
MCV: 87.5 fL (ref 78.0–100.0)
MPV: 9 fL (ref 8.6–12.4)
Monocytes Absolute: 0.5 10*3/uL (ref 0.1–1.0)
Monocytes Relative: 8 % (ref 3–12)
Neutro Abs: 3.7 10*3/uL (ref 1.7–7.7)
Neutrophils Relative %: 62 % (ref 43–77)
Platelets: 328 10*3/uL (ref 150–400)
RBC: 4.71 MIL/uL (ref 4.22–5.81)
RDW: 14.2 % (ref 11.5–15.5)
WBC: 6 10*3/uL (ref 4.0–10.5)

## 2015-03-22 LAB — COMPREHENSIVE METABOLIC PANEL
ALK PHOS: 94 U/L (ref 40–115)
ALT: 12 U/L (ref 9–46)
AST: 18 U/L (ref 10–35)
Albumin: 4.4 g/dL (ref 3.6–5.1)
BILIRUBIN TOTAL: 0.4 mg/dL (ref 0.2–1.2)
BUN: 15 mg/dL (ref 7–25)
CO2: 30 mmol/L (ref 20–31)
Calcium: 9.6 mg/dL (ref 8.6–10.3)
Chloride: 103 mmol/L (ref 98–110)
Creat: 1.57 mg/dL — ABNORMAL HIGH (ref 0.70–1.33)
Glucose, Bld: 82 mg/dL (ref 65–99)
POTASSIUM: 4.3 mmol/L (ref 3.5–5.3)
Sodium: 138 mmol/L (ref 135–146)
TOTAL PROTEIN: 7.4 g/dL (ref 6.1–8.1)

## 2015-03-22 LAB — CD4/CD8 (T-HELPER/T-SUPPRESSOR CELL)
CD4%: 43.6
CD4: 1003
CD8 % Suppressor T Cell: 37.3
CD8: 858

## 2015-03-22 LAB — PHOSPHORUS: PHOSPHORUS: 3.1 mg/dL (ref 2.5–4.5)

## 2015-03-22 LAB — HIV-1 RNA QUANT-NO REFLEX-BLD

## 2015-03-23 LAB — FLUORESCENT TREPONEMAL AB(FTA)-IGG-BLD: Fluorescent Treponemal ABS: REACTIVE — AB

## 2015-03-23 LAB — POCT URINALYSIS DIPSTICK
BILIRUBIN UA: NEGATIVE
Glucose, UA: NEGATIVE
Ketones, UA: NEGATIVE
LEUKOCYTES UA: NEGATIVE
NITRITE UA: NEGATIVE
PROTEIN UA: NEGATIVE
Spec Grav, UA: 1.015
Urobilinogen, UA: 0.2
pH, UA: 7

## 2015-03-23 LAB — RPR TITER: RPR Titer: 1:8 {titer}

## 2015-03-23 LAB — RPR: RPR Ser Ql: REACTIVE — AB

## 2015-03-23 LAB — HEPATITIS C ANTIBODY: HCV Ab: REACTIVE — AB

## 2015-03-23 LAB — HEPATITIS C RNA QUANTITATIVE: HCV QUANT: NOT DETECTED [IU]/mL (ref <15)

## 2015-03-23 NOTE — Progress Notes (Signed)
Erik Bautista is here today to screen for study [phone]: A Randomized Double Blinded, Placebo Controlled Trial Comparing Antiretroviral Intensification with Maraviroc or Dolutegravir for the Treatment of Cognitive Impairment in HIV. Informed consent was obtained after the consent was reviewed with him and he had time to look over it and ask questions. He understands that if eligible he will be given either maraviroc, dolutegravir or both to take with his existing regimen of Truvada, Prezista and norvir. He had said he had been having trouble remembering things and being able to focus more so over the past year. He denies being depressed but does say he is lonely at times and worries about growing old alone. The neuro tests and questionnaires were all completed and will be sent in to the PI for review and eligibility. Since he is able to read and speak in [place] well we opted to do most of the tests in [place] and did have him do the WRAT in Spanish (pronouncing words) and the BECK Depression screening in Spanish.

## 2015-04-10 ENCOUNTER — Other Ambulatory Visit: Payer: Self-pay | Admitting: Infectious Diseases

## 2015-04-14 ENCOUNTER — Other Ambulatory Visit: Payer: Self-pay | Admitting: *Deleted

## 2015-04-14 DIAGNOSIS — B20 Human immunodeficiency virus [HIV] disease: Secondary | ICD-10-CM

## 2015-04-14 MED ORDER — RITONAVIR 100 MG PO TABS: 100.0000 mg | ORAL_TABLET | Freq: Every day | ORAL | Status: DC

## 2015-04-20 ENCOUNTER — Encounter (HOSPITAL_COMMUNITY): Payer: Self-pay

## 2015-04-20 ENCOUNTER — Emergency Department (HOSPITAL_COMMUNITY)
Admission: EM | Admit: 2015-04-20 | Discharge: 2015-04-20 | Disposition: A | Discharge status: Home / Self Care | Payer: BLUE CROSS/BLUE SHIELD | Attending: Emergency Medicine | Admitting: Emergency Medicine

## 2015-04-20 ENCOUNTER — Emergency Department (HOSPITAL_COMMUNITY): Payer: BLUE CROSS/BLUE SHIELD

## 2015-04-20 DIAGNOSIS — Y998 Other external cause status: Secondary | ICD-10-CM | POA: Diagnosis not present

## 2015-04-20 DIAGNOSIS — Y9389 Activity, other specified: Secondary | ICD-10-CM | POA: Diagnosis not present

## 2015-04-20 DIAGNOSIS — B20 Human immunodeficiency virus [HIV] disease: Secondary | ICD-10-CM | POA: Diagnosis not present

## 2015-04-20 DIAGNOSIS — Z8639 Personal history of other endocrine, nutritional and metabolic disease: Secondary | ICD-10-CM | POA: Insufficient documentation

## 2015-04-20 DIAGNOSIS — S6992XA Unspecified injury of left wrist, hand and finger(s), initial encounter: Secondary | ICD-10-CM | POA: Diagnosis present

## 2015-04-20 DIAGNOSIS — W270XXA Contact with workbench tool, initial encounter: Secondary | ICD-10-CM | POA: Insufficient documentation

## 2015-04-20 DIAGNOSIS — Z23 Encounter for immunization: Secondary | ICD-10-CM | POA: Insufficient documentation

## 2015-04-20 DIAGNOSIS — Z87891 Personal history of nicotine dependence: Secondary | ICD-10-CM | POA: Insufficient documentation

## 2015-04-20 DIAGNOSIS — IMO0002 Reserved for concepts with insufficient information to code with codable children: Secondary | ICD-10-CM

## 2015-04-20 DIAGNOSIS — Y9289 Other specified places as the place of occurrence of the external cause: Secondary | ICD-10-CM | POA: Insufficient documentation

## 2015-04-20 DIAGNOSIS — Z79899 Other long term (current) drug therapy: Secondary | ICD-10-CM | POA: Insufficient documentation

## 2015-04-20 DIAGNOSIS — R52 Pain, unspecified: Secondary | ICD-10-CM

## 2015-04-20 DIAGNOSIS — S61213A Laceration without foreign body of left middle finger without damage to nail, initial encounter: Secondary | ICD-10-CM | POA: Insufficient documentation

## 2015-04-20 MED ORDER — ACETAMINOPHEN 500 MG PO TABS: 500.0000 mg | ORAL_TABLET | Freq: Four times a day (QID) | ORAL | Status: DC | PRN

## 2015-04-20 MED ORDER — BENZONATATE 100 MG PO CAPS: 100.0000 mg | ORAL_CAPSULE | Freq: Once | ORAL | Status: DC

## 2015-04-20 MED ORDER — TETANUS-DIPHTH-ACELL PERTUSSIS 5-2.5-18.5 LF-MCG/0.5 IM SUSP
0.5000 mL | Freq: Once | INTRAMUSCULAR | Status: AC
  Administered 2015-04-20: 0.5 mL via INTRAMUSCULAR
  Filled 2015-04-20: qty 0.5

## 2015-04-20 MED ORDER — LIDOCAINE HCL (PF) 1 % IJ SOLN
5.0000 mL | Freq: Once | INTRAMUSCULAR | Status: AC
Start: 2015-04-20 — End: 2015-04-20
  Administered 2015-04-20: 5 mL via INTRADERMAL
  Filled 2015-04-20: qty 5

## 2015-04-20 MED ORDER — ACETAMINOPHEN 325 MG PO TABS
650.0000 mg | ORAL_TABLET | Freq: Once | ORAL | Status: AC
  Administered 2015-04-20: 650 mg via ORAL
  Filled 2015-04-20: qty 2

## 2015-04-20 MED ORDER — AMOXICILLIN-POT CLAVULANATE 875-125 MG PO TABS: 1.0000 | ORAL_TABLET | Freq: Two times a day (BID) | ORAL | Status: DC

## 2015-04-20 NOTE — ED Provider Notes (Signed)
CSN: [id number]     Arrival date & time 04/20/15  1734 History  By signing my name below, I, Helane Gunther, attest that this documentation has been prepared under the direction and in the presence of [employer], PA-C. Electronically Signed: Helane Gunther, ED Scribe. 04/20/2015. 6:24 PM.    Chief Complaint  Patient presents with  . Hand Injury   The history is provided by the patient. No language interpreter was used.   HPI Comments: Nathanuel Jude is a 49 y.o. male with a PMHx of HIV and HLD who presents to the Emergency Department complaining of a laceration to the left middle finger sustained 2 hours ago. Pt states he was using gloves while cutting a piece of wood with a table saw, when the glove got caught and he cut himself. He reports associated pain to the area, as well as numbness to the tip of the finger. He states that when he looked at the wound, he thought it looked white. He notes he is still able to move all of his fingers. He states he believes his last tetanus shot was 5 years ago. He notes he has not taken anything for pain.   Past Medical History  Diagnosis Date  . HIV infection ([place])   . Hyperlipidemia    History reviewed. No pertinent past surgical history. No family history on file. Social History  Substance Use Topics  . Smoking status: Former Smoker    Quit date: 06/03/1988  . Smokeless tobacco: Never Used  . Alcohol Use: No    Review of Systems A complete 10 system review of systems was obtained and all systems are negative except as noted in the HPI and PMH.   Allergies  Review of patient's allergies indicates no known allergies.  Home Medications   Prior to Admission medications   Medication Sig Start Date End Date Taking? Authorizing Provider  acetaminophen (TYLENOL) 500 MG tablet Take 1 tablet (500 mg total) by mouth every 6 (six) hours as needed. 04/20/15   Gloriann Loan, PA-C  amoxicillin-clavulanate (AUGMENTIN) 875-125 MG tablet Take 1 tablet by  mouth every 12 (twelve) hours. 04/20/15   Gloriann Loan, PA-C  hydrOXYzine (ATARAX/VISTARIL) 10 MG tablet Take 10 mg by mouth 3 (three) times daily as needed.    Historical Provider, MD  PREZISTA 800 MG tablet TAKE 1 TABLET BY MOUTH DAILY WITH BREAKFAST 04/11/15   Thayer Headings, MD  ritonavir (NORVIR) 100 MG TABS tablet Take 1 tablet (100 mg total) by mouth daily. Take with Prezista. 04/14/15   Thayer Headings, MD  TRUVADA 200-300 MG per tablet TAKE 1 TABLET BY MOUTH EVERY DAY 11/14/14   Campbell Riches, MD   BP 113/83 mmHg  Pulse 65  Temp(Src) 98.2 F (36.8 C) (Oral)  Resp 16  SpO2 99% Physical Exam  Constitutional: He is oriented to person, place, and time. He appears well-developed and well-nourished.  HENT:  Head: Normocephalic and atraumatic.  Right Ear: External ear normal.  Left Ear: External ear normal.  Eyes: Conjunctivae are normal. Right eye exhibits no discharge. Left eye exhibits no discharge.  Neck: Normal range of motion. Neck supple.  Pulmonary/Chest: Effort normal. No respiratory distress.  Abdominal: He exhibits no distension.  Musculoskeletal:       Right wrist: Normal.       Right hand: He exhibits laceration (1.5 cm deep laceration involving all layers of the skin on dorsal aspect of left middle finger across the DIP.  No nail involvement ).  He exhibits normal capillary refill. Normal sensation noted. Normal strength noted.       Hands: FAROM of left middle finger at PIP and DIP joints.   Neurological: He is alert and oriented to person, place, and time. Coordination normal.  Skin: Skin is warm and dry. No rash noted. He is not diaphoretic. No erythema.  Psychiatric: He has a normal mood and affect.  Nursing note and vitals reviewed.   ED Course  Procedures   The wound is cleansed, debrided of foreign material as much as possible, and dressed. The patient is alerted to watch for any signs of infection (redness, pus, pain, increased swelling or fever) and call if  such occurs. Home wound care instructions are provided. Tetanus vaccination status reviewed: tetanus status unknown to the patient.  LACERATION REPAIR Performed by: Gloriann Loan Consent: Verbal consent obtained. Risks and benefits: risks, benefits and alternatives were discussed Patient identity confirmed: provided demographic data Time out performed prior to procedure Prepped and Draped in normal sterile fashion Wound explored Laceration Location: dorsal aspect of left middle finger over the DIP joint Laceration Length: 1.5 cm No Foreign Bodies seen or palpated Anesthesia: local infiltration Local anesthetic: lidocaine 1% without epinephrine Anesthetic total: 4 ml Irrigation method: syringe Amount of cleaning: standard Skin closure: 5-0 prolene Number of sutures or staples: 4 Technique: simple interrupted Patient tolerance: Patient tolerated the procedure well with no immediate complications.  DIAGNOSTIC STUDIES: Oxygen Saturation is 98% on RA, normal by my interpretation.    COORDINATION OF CARE: 6:22 PM - Discussed plans to let the finger soak for a while, in order to better examine it, as well as to order diagnostic imaging. Pt advised of plan for treatment and pt agrees.  Labs Review Labs Reviewed - No data to display  Imaging Review Dg Finger Middle Left  04/20/2015  CLINICAL DATA:  Cut middle finger on table saw, laceration EXAM: LEFT MIDDLE FINGER 2+V COMPARISON:  None. FINDINGS: Soft tissue laceration along the dorsal aspect of the distal phalanx/DIP joint. No fracture or dislocation is seen. The joint spaces are preserved. No radiopaque foreign body is seen. IMPRESSION: Soft tissue laceration along the dorsal aspect of the distal phalanx/DIP joint. No fracture, dislocation, or radiopaque foreign body is seen. Electronically Signed   By: Julian Hy M.D.   On: 04/20/2015 19:22   I have personally reviewed and evaluated these images and lab results as part of my  medical decision-making.   EKG Interpretation None      MDM   Final diagnoses:  Laceration    Patient presents with laceration to left middle finger.  VSS, NAD.  On exam, laceration involving all layers of the skin.  FAROM of DIP and PIP joints.  Capillary refill less than 3 seconds.  Sensation and strength intact.  Doubt tendon laceration.  Will give tetanus.  Will obtain plain films of left middle finger to evaluate for FB.  Plain films show no evidence of FB, fracture, or dislocation.  Will perform laceration repair and place in splint. D/c home with tylenol and augmentin.  Evaluation does not show pathology requring ongoing emergent intervention or admission. Pt is hemodynamically stable and mentating appropriately. Discussed findings/results and plan with patient/guardian, who agrees with plan. All questions answered. Return precautions discussed and outpatient follow up given.    I personally performed the services described in this documentation, which was scribed in my presence. The recorded information has been reviewed and is accurate.     Lonn Georgia  Emmaline Life 04/20/15 2023  Tanna Furry, MD 04/27/15 0800

## 2015-04-20 NOTE — ED Notes (Signed)
PA at the bedside suturing. 

## 2015-04-20 NOTE — ED Notes (Signed)
Applied Telfa to cover the wound and placed finger splint under finger and wrapped it with CoFlex.

## 2015-04-20 NOTE — ED Notes (Signed)
Pt reports he cut his left middle finger with table saw today around 1600. Laceration to finger, bleeding controlled. Pt states he is HIV positive.

## 2015-04-20 NOTE — Discharge Instructions (Signed)

## 2015-04-24 ENCOUNTER — Telehealth: Payer: Self-pay | Admitting: *Deleted

## 2015-04-24 NOTE — Telephone Encounter (Signed)
Pt's RPR Titre increased to 1:8 from 1:4 in 2014.  Health Department wanting to know whether Dr. Linus Salmons considers this a new case requiring treatment.  Pt has f/u OV on 05/04/15 @ 10 AM.

## 2015-04-24 NOTE — Telephone Encounter (Signed)
No, I don't think it is reinfection.  thanks

## 2015-05-04 ENCOUNTER — Ambulatory Visit (INDEPENDENT_AMBULATORY_CARE_PROVIDER_SITE_OTHER): Payer: BLUE CROSS/BLUE SHIELD | Admitting: Internal Medicine

## 2015-05-04 ENCOUNTER — Encounter: Payer: Self-pay | Admitting: Internal Medicine

## 2015-05-04 VITALS — BP 131/87 | HR 68 | Temp 97.5°F | Wt 167.0 lb

## 2015-05-04 DIAGNOSIS — B20 Human immunodeficiency virus [HIV] disease: Secondary | ICD-10-CM

## 2015-05-04 DIAGNOSIS — S61219A Laceration without foreign body of unspecified finger without damage to nail, initial encounter: Secondary | ICD-10-CM | POA: Insufficient documentation

## 2015-05-04 DIAGNOSIS — B1920 Unspecified viral hepatitis C without hepatic coma: Secondary | ICD-10-CM | POA: Diagnosis not present

## 2015-05-04 NOTE — Assessment & Plan Note (Signed)
Will consider checking an elastography next visit.

## 2015-05-04 NOTE — Assessment & Plan Note (Signed)
Removed stitches today.  No bleeding, no pus.

## 2015-05-04 NOTE — Assessment & Plan Note (Signed)
Doing well on the study, rtc 6 months

## 2015-05-04 NOTE — Progress Notes (Signed)
  Subjective:    Patient ID: Erik Bautista, male    DOB: 1965/12/22, 49 y.o.   MRN: [id number]  HPI He comes in for followup of his HIV and hep C.    He continues to take his medicine for HIV and is undetectable on a 6 year study.  For his hepatitis C, he has genotype 3 with active virus and on treatment with ribavirin and sofosbuvir which he finished in 2015.    His HCV viral load SVR 12 is undetectable.     Has had some memory issues, a little more noticeable than usual.  Has not been reading like he used to due to time.  He recently cut his finger at work and had stitches placed.     Review of Systems  HENT: Negative for sore throat and trouble swallowing.   Cardiovascular: Negative for leg swelling.  Gastrointestinal: Negative for nausea, abdominal pain, diarrhea and abdominal distention.  Neurological: Negative for dizziness and headaches.       Objective:   Physical Exam  Constitutional: He appears well-developed and well-nourished. No distress.  HENT:  Mouth/Throat: No oropharyngeal exudate.  Eyes: No scleral icterus.  Cardiovascular: Normal rate, regular rhythm and normal heart sounds.   No murmur heard. Pulmonary/Chest: Effort normal and breath sounds normal. No respiratory distress.  Lymphadenopathy:    He has no cervical adenopathy.  Skin: No rash noted.   Social History   Social History  . Marital Status: Married    Spouse Name: N/A  . Number of Children: N/A  . Years of Education: N/A   Occupational History  . Not on file.   Social History Main Topics  . Smoking status: Former Smoker    Quit date: 06/03/1988  . Smokeless tobacco: Never Used  . Alcohol Use: No  . Drug Use: No     Comment: monthly  . Sexual Activity: Yes     Comment: pt. given condoms   Other Topics Concern  . Not on file   Social History Narrative         Assessment & Plan:

## 2015-05-05 ENCOUNTER — Other Ambulatory Visit: Payer: Self-pay | Admitting: Internal Medicine

## 2015-05-05 DIAGNOSIS — B182 Chronic viral hepatitis C: Secondary | ICD-10-CM

## 2015-05-09 ENCOUNTER — Telehealth: Payer: Self-pay | Admitting: *Deleted

## 2015-05-09 NOTE — Telephone Encounter (Signed)
Called and left patient a voice mail to inform him of appt for ultrasound at [facility] on 05/31/15 at 6:45 AM. Nothing to eat or drink after midnight. Requested that he call me back to confirm. Myrtis Hopping

## 2015-05-12 ENCOUNTER — Other Ambulatory Visit: Payer: Self-pay | Admitting: Infectious Diseases

## 2015-05-12 ENCOUNTER — Other Ambulatory Visit: Payer: Self-pay | Admitting: Internal Medicine

## 2015-05-31 ENCOUNTER — Ambulatory Visit (HOSPITAL_COMMUNITY)
Admission: RE | Admit: 2015-05-31 | Discharge: 2015-05-31 | Disposition: A | Discharge status: Home / Self Care | Payer: BLUE CROSS/BLUE SHIELD | Source: Ambulatory Visit | Attending: Internal Medicine | Admitting: Internal Medicine

## 2015-05-31 DIAGNOSIS — B182 Chronic viral hepatitis C: Secondary | ICD-10-CM

## 2015-06-21 ENCOUNTER — Ambulatory Visit (HOSPITAL_COMMUNITY)
Admission: RE | Admit: 2015-06-21 | Discharge: 2015-06-21 | Disposition: A | Discharge status: Home / Self Care | Payer: BLUE CROSS/BLUE SHIELD | Source: Ambulatory Visit | Attending: Internal Medicine | Admitting: Internal Medicine

## 2015-06-21 DIAGNOSIS — B182 Chronic viral hepatitis C: Secondary | ICD-10-CM | POA: Diagnosis present

## 2015-06-28 ENCOUNTER — Telehealth: Payer: Self-pay | Admitting: *Deleted

## 2015-06-28 DIAGNOSIS — Z0000 Encounter for general adult medical examination without abnormal findings: Secondary | ICD-10-CM

## 2015-06-28 NOTE — Addendum Note (Signed)
Addended by: Landis Gandy on: 06/28/2015 09:45 AM   Modules accepted: Orders

## 2015-06-28 NOTE — Telephone Encounter (Signed)
Patient asking for referral for colonoscopy.  Also previously treated for hep c by [facility] hepatology clinic . Please advise where you would like to send him. Landis Gandy, RN

## 2015-06-28 NOTE — Telephone Encounter (Signed)
Ok to refer for colonoscopy ?

## 2015-09-19 ENCOUNTER — Encounter: Payer: Self-pay | Admitting: *Deleted

## 2015-09-19 ENCOUNTER — Encounter (INDEPENDENT_AMBULATORY_CARE_PROVIDER_SITE_OTHER): Payer: BLUE CROSS/BLUE SHIELD | Admitting: *Deleted

## 2015-09-19 VITALS — BP 123/77 | HR 49 | Temp 97.9°F | Resp 16 | Wt 176.5 lb

## 2015-09-19 DIAGNOSIS — Z006 Encounter for examination for normal comparison and control in clinical research program: Secondary | ICD-10-CM

## 2015-09-19 LAB — HIV-1 RNA QUANT-NO REFLEX-BLD

## 2015-09-19 NOTE — Progress Notes (Signed)
Erik Bautista is here for his week 97 visit for The HAILO Study: A Long Term follow-up of Older HIV-Infected Adults in the ACTG, an observational study addressing the issues of aging, HIV infection and Inflammation. He reports feeling more depressed, isolated and anxious about the future. We had discussed this in the past and he is now ready to see a counselor. I have scheduled him with Grayland Ormond next week and he promises to come. He says that he has to make himself do things that he needs to do, like pay bills, get out of bed when he's not working. He is worried about his health. He had an elastography done in January and his fibrosis score was 2/3 even though his Hep C has been treated and he was asking  what he needed to do about it. He does not have a partner or any family around here who can assist him if he needed any help. I am having him come back for the next study visit in July so he can get all his labs done then and then schedule an appt. With Dr. Linus Salmons.

## 2015-09-26 ENCOUNTER — Ambulatory Visit: Payer: BLUE CROSS/BLUE SHIELD

## 2015-09-26 DIAGNOSIS — F4329 Adjustment disorder with other symptoms: Secondary | ICD-10-CM

## 2015-09-26 NOTE — BH Specialist Note (Signed)
I met with Erik Bautista today for the first time and he reported having no energy lately, starting about a year ago, oversleeping - 12 hours/day, overeating - gaining 20 lbs in the past few months.  However, he reports no depressed mood, no crying spells. He admits that he is a bit of a "hoarder", leaving things lying around his house that he plans to get to someday. He said he avoids doing things he has to do, such as paying bills (except the mortgage, which he pays), cleaning up behind himself, etc.  He recently forgot to get his car inspected.  I asked if he has ever had his thyroid checked and he said no.  I explained that this can mimic depression.  I also mentioned low testosterone.  I sent a message to Dr. Linus Salmons about these concerns.  He lives alone and has no contact with family.  He cut off contact with them because of their reaction to him coming out as gay. He has a past history of drug use, but says he has been drug free for the most part for the past 2 years or so.  Plan to meet again in one month. [name], LCSW

## 2015-09-28 ENCOUNTER — Telehealth: Payer: Self-pay | Admitting: *Deleted

## 2015-09-28 NOTE — Telephone Encounter (Signed)
-----  Message from Thayer Headings, MD sent at 09/28/2015  7:59 AM EDT ----- Regarding: FW: thyroid? Can you get him scheduled with me at my next available appt in May he can come to to discuss his energy, ? Thyroid per Kenny's note. thanks ----- Message -----    From: Merita Norton, LCSW    Sent: 09/26/2015   2:44 PM      To: Thayer Headings, MD Subject: thyroid?                                       Hey, I just met with Delwin and he baffles me diagnostically - no depressed mood, no crying spells, but tired to the point of not paying bills (except his mortgage), not taking care of things. He said he sleeps a lot and has gained 20 lbs in the past few months.  I just wondered if we could order a thyroid panel on him?  Thyroid imbalance can mimic depression - no energy, etc.  I also wondered about testosterone level but I'm less sure about that. Your thoughts?

## 2015-09-28 NOTE — Telephone Encounter (Signed)
Called patient and left a voice mail to return the call. Dr. Linus Salmons has available appts late afternoon on 10/25/15. Myrtis Hopping

## 2015-10-03 ENCOUNTER — Ambulatory Visit: Payer: BLUE CROSS/BLUE SHIELD

## 2015-10-05 ENCOUNTER — Ambulatory Visit: Payer: BLUE CROSS/BLUE SHIELD

## 2015-10-05 DIAGNOSIS — F4329 Adjustment disorder with other symptoms: Secondary | ICD-10-CM

## 2015-10-05 NOTE — BH Specialist Note (Signed)
Erik Bautista was pleasant again today and reported that he did not return the call he got from the clinic to schedule with Dr. Linus Salmons.  I made sure he scheduled with Dr. Linus Salmons before he left.  We completed a treatment plan with a goal to increase his energy.  I also tried to assess him for Bipolar Disorder and he does have some symptoms but it appears that it is not definitive.  He is easily distracted and does sometimes have periods of less sleep, but he says it is because he is working on something and drinks coffee to keep himself up.  He has no history of risky behavior or spending sprees.  I told him I had spoken with Dr. Linus Salmons and we both were concerned that this lack of energy may be a medical issue and not a psychiatric one.  Plan to meet again in 3 weeks. [name], LCSW

## 2015-10-06 ENCOUNTER — Encounter: Payer: Self-pay | Admitting: Infectious Disease

## 2015-10-26 ENCOUNTER — Ambulatory Visit: Payer: BLUE CROSS/BLUE SHIELD

## 2015-11-29 ENCOUNTER — Encounter: Payer: Self-pay | Admitting: Internal Medicine

## 2015-11-29 ENCOUNTER — Ambulatory Visit (INDEPENDENT_AMBULATORY_CARE_PROVIDER_SITE_OTHER): Payer: BLUE CROSS/BLUE SHIELD | Admitting: Internal Medicine

## 2015-11-29 VITALS — BP 123/69 | HR 58 | Temp 98.3°F | Ht 70.0 in | Wt 174.0 lb

## 2015-11-29 DIAGNOSIS — R41840 Attention and concentration deficit: Secondary | ICD-10-CM

## 2015-11-29 MED ORDER — RITONAVIR 100 MG PO TABS: 100.0000 mg | ORAL_TABLET | Freq: Every day | ORAL | Status: DC

## 2015-11-29 MED ORDER — EMTRICITABINE-TENOFOVIR AF 200-25 MG PO TABS: 1.0000 | ORAL_TABLET | Freq: Every day | ORAL | Status: DC

## 2015-11-29 MED ORDER — DARUNAVIR ETHANOLATE 800 MG PO TABS: 800.0000 mg | ORAL_TABLET | Freq: Every day | ORAL | Status: DC

## 2015-11-30 LAB — FLUORESCENT TREPONEMAL AB(FTA)-IGG-BLD: Fluorescent Treponemal ABS: REACTIVE — AB

## 2015-11-30 LAB — RPR: RPR Ser Ql: REACTIVE — AB

## 2015-11-30 LAB — VITAMIN B12: Vitamin B-12: 584 pg/mL (ref 200–1100)

## 2015-11-30 LAB — TSH: TSH: 1.22 mIU/L (ref 0.40–4.50)

## 2015-11-30 LAB — RPR TITER

## 2015-12-01 NOTE — Assessment & Plan Note (Signed)
I discussed with him potential early dementia as a cause, maybe depression or otherwise though he has really minimized the symptoms and does not feel a work up is necessary.  He may be right, though I can't help him explain why he remembers but does not pay his bills that he says he has the money for.  I can't really localize anything else.  In discussion with him, I will check some basic labs and I would like him to see a neurologist to see if there are any concerns and if any work up, such as an MRI or LP for ? Neurosyphilis (had syphilis last year) is indicated.

## 2015-12-01 NOTE — Progress Notes (Signed)
   Subjective:    Patient ID: Erik Bautista, male    DOB: 05/31/1966, 50 y.o.   MRN: [id number]  HPI Here for follow up of depression.  Has been seeing our counselor and has noted some lack of energy or desire and has affected him paying bills and some activities.  He continues to work and has no issues with work or concerns.  He tells me he has not been depressed, no concentration issues though in discussion with his counselor, there has been some attention or concentration concerns.  He denies any real forgetfullness.  He is not concerned and does not feel that a 'fuss' is necessary.  Denies short or long term memory issues.     Review of Systems  Neurological: Negative for dizziness, seizures and headaches.  Psychiatric/Behavioral: Negative for sleep disturbance and dysphoric mood. The patient is not nervous/anxious.        Objective:   Physical Exam  Constitutional: He is oriented to person, place, and time. He appears well-developed and well-nourished.  Eyes: No scleral icterus.  Neurological: He is alert and oriented to person, place, and time. No cranial nerve deficit.  Skin: No rash noted.  Psychiatric: He has a normal mood and affect. His behavior is normal.          Assessment & Plan:

## 2015-12-21 ENCOUNTER — Encounter (INDEPENDENT_AMBULATORY_CARE_PROVIDER_SITE_OTHER): Payer: BLUE CROSS/BLUE SHIELD | Admitting: *Deleted

## 2015-12-21 VITALS — BP 107/68 | HR 56 | Temp 98.1°F | Resp 16 | Ht 68.5 in | Wt 176.2 lb

## 2015-12-21 DIAGNOSIS — R5383 Other fatigue: Secondary | ICD-10-CM

## 2015-12-21 DIAGNOSIS — F329 Major depressive disorder, single episode, unspecified: Secondary | ICD-10-CM | POA: Diagnosis not present

## 2015-12-21 DIAGNOSIS — Z006 Encounter for examination for normal comparison and control in clinical research program: Secondary | ICD-10-CM

## 2015-12-21 LAB — COMPREHENSIVE METABOLIC PANEL
ALBUMIN: 4 g/dL (ref 3.6–5.1)
ALK PHOS: 73 U/L (ref 40–115)
ALT: 17 U/L (ref 9–46)
AST: 17 U/L (ref 10–35)
BILIRUBIN TOTAL: 0.5 mg/dL (ref 0.2–1.2)
BUN: 20 mg/dL (ref 7–25)
CALCIUM: 9.1 mg/dL (ref 8.6–10.3)
CO2: 24 mmol/L (ref 20–31)
Chloride: 106 mmol/L (ref 98–110)
Creat: 1.12 mg/dL (ref 0.70–1.33)
Glucose, Bld: 103 mg/dL — ABNORMAL HIGH (ref 65–99)
Potassium: 4 mmol/L (ref 3.5–5.3)
Sodium: 138 mmol/L (ref 135–146)
TOTAL PROTEIN: 6.9 g/dL (ref 6.1–8.1)

## 2015-12-21 LAB — TESTOSTERONE: Testosterone: 569 ng/dL (ref 250–827)

## 2015-12-21 LAB — LIPID PANEL
CHOLESTEROL: 223 mg/dL — AB (ref 125–200)
HDL: 54 mg/dL (ref >=40)
LDL CALC: 136 mg/dL — AB (ref <130)
TRIGLYCERIDES: 167 mg/dL — AB (ref <150)
Total CHOL/HDL Ratio: 4.1 Ratio (ref <=5.0)
VLDL: 33 mg/dL — ABNORMAL HIGH (ref <30)

## 2015-12-21 NOTE — Progress Notes (Signed)
Erik Bautista is here for his week 29 visit for The HAILO Study: A Long Term follow-up of Older HIV-Infected Adults in the ACTG, an observational study addressing the issues of aging, HIV infection and Inflammation. He continues to report memory problems and lack of drive/desire to do anything. He has been seeing Grayland Ormond and has an appt. to see a neurologist soon. This has been going on for a couple years now and he says it is getting worse. He has put on close to 20 lbs. In less than a year and he says he is eating more and doing less. I added a testosterone level to his labs today to check for low testosterone with Dr. Henreitta Leber okay. He is concerned about dementia and has no family or support systems in place for care as he gets older. He is reconsidering screening for the neurocognitive study if his creatinine is normal on today's labs. A low CrCl kept him from enrolling last year. The study will evaluate to see if adding an integrase inhibitor (Maraviroc or Tivicay or both) helps improve neurocognitive function in people with HIV since they seem to penetrate the brain better. His return appt. For [id number] is in January of 2018.

## 2015-12-22 LAB — PROTEIN, URINE, RANDOM: TOTAL PROTEIN, URINE: 32 mg/dL — AB (ref 5–25)

## 2015-12-22 LAB — HEMOGLOBIN A1C
Hgb A1c MFr Bld: 5.5 % (ref <5.7)
Mean Plasma Glucose: 111 mg/dL

## 2015-12-22 LAB — CREATININE, URINE, RANDOM: Creatinine, Urine: 279 mg/dL (ref 20–370)

## 2015-12-30 LAB — HIV-1 RNA QUANT-NO REFLEX-BLD

## 2016-01-02 ENCOUNTER — Ambulatory Visit (INDEPENDENT_AMBULATORY_CARE_PROVIDER_SITE_OTHER): Payer: BLUE CROSS/BLUE SHIELD | Admitting: Neurology

## 2016-01-02 ENCOUNTER — Encounter: Payer: Self-pay | Admitting: Neurology

## 2016-01-02 VITALS — BP 110/74 | HR 60 | Ht 70.0 in | Wt 175.0 lb

## 2016-01-02 DIAGNOSIS — B20 Human immunodeficiency virus [HIV] disease: Secondary | ICD-10-CM | POA: Diagnosis not present

## 2016-01-02 DIAGNOSIS — F32A Depression, unspecified: Secondary | ICD-10-CM

## 2016-01-02 DIAGNOSIS — R413 Other amnesia: Secondary | ICD-10-CM | POA: Diagnosis not present

## 2016-01-02 DIAGNOSIS — F329 Major depressive disorder, single episode, unspecified: Secondary | ICD-10-CM | POA: Diagnosis not present

## 2016-01-02 NOTE — Patient Instructions (Addendum)
1. Schedule MRI brain with and without contrast. We have sent a referral to [facility] for your MRI and they will call you directly to schedule your appt. They are located at [place]. If you need to contact them directly please call [phone]. 2. Refer to Psychiatry for treatment of depression 3. Physical exercise and brain stimulation exercises are important for brain health 4. Our office will call you with MRI results. If any need to return, we will schedule a follow-up visit, otherwise follow-up as needed

## 2016-01-02 NOTE — Progress Notes (Signed)
NEUROLOGY CONSULTATION NOTE  Larue Poitevint MRN: [id number] DOB: [birth year]  Referring provider: Dr. Scharlene Gloss Primary care provider: none  Reason for consult:  Attention or concentration deficit  Dear Dr Linus Salmons:  Thank you for your kind referral of Drayke Finamore for consultation of the above symptoms. Although his history is well known to you, please allow me to reiterate it for the purpose of our medical record. Records and images were personally reviewed where available.   HISTORY OF PRESENT ILLNESS: This is a pleasant 50 year old left-handed man with a history of HIV, hepatitis C, syphilis, presenting for evaluation of cognitive changes that started a year or so ago, but worsened in the past 6 months. He reports he has always been bad with names, but lately has been forgetting what he went into a room to get. He has gotten lost driving, but states this is because he has only been driving since age 61. He misplaces things frequently but states his house is a mess. He lives alone. He occasionally misses his medication, but is overall compliant. He works driving a Forensic scientist for [employer], no difficulties at work, but co-workers have told him he repeats himself. He reports his big problem is missing bill payments, he has the money, but he just "doesn't want to deal with it." He does not want to answer the phone or the door. He has problems with people and does not want to go outside. He feels like people are different with the new president, relating a story of people yelling at him on the highway the day after the president was sworn in. He reports sleep is good, but he stays in bed because "I just don't want to get up." He likes to paint but has lost interest in it recently. He had depression at age 50 when he first moved to the [place], but has never taken any anti-depressants. He does not feel psychotherapy has been helpful for him. His therapist was concerned that lack of energy may be a  medical issue and not a psychiatric one. His TSH, B12 were normal. RPR reactive.  He denies any headaches, dizziness, diplopia, dysarthria, dysphagia, neck/back pain, focal numbness/tingling/weakness, bowel/bladder dysfunction. No anosmia or tremors. He denies any family history of dementia, no history of significant head injuries, or alcohol intake. He was treated for syphilis in 2015. His HIV and HCV viral load are undetectable.  Laboratory Data: Lab Results  Component Value Date   TSH 1.22 11/29/2015   Lab Results  Component Value Date   [id number] 11/29/2015   RPR 11/29/15 Reactive, FTA-Abs Reactive, RPR titer 1:4  PAST MEDICAL HISTORY: Past Medical History:  Diagnosis Date  . HIV infection ([place])   . Hyperlipidemia     PAST SURGICAL HISTORY: No past surgical history on file.  MEDICATIONS: Current Outpatient Prescriptions on File Prior to Visit  Medication Sig Dispense Refill  . acetaminophen (TYLENOL) 500 MG tablet Take 1 tablet (500 mg total) by mouth every 6 (six) hours as needed. 30 tablet 0  . Darunavir Ethanolate (PREZISTA) 800 MG tablet Take 1 tablet (800 mg total) by mouth daily with breakfast. 30 tablet 6  . emtricitabine-tenofovir AF (DESCOVY) 200-25 MG tablet Take 1 tablet by mouth daily. 30 tablet 5  . ritonavir (NORVIR) 100 MG TABS tablet Take 1 tablet (100 mg total) by mouth daily. 30 tablet 6   No current facility-administered medications on file prior to visit.     ALLERGIES: No Known Allergies  FAMILY HISTORY: No family history on file.  SOCIAL HISTORY: Social History   Social History  . Marital status: Married    Spouse name: N/A  . Number of children: N/A  . Years of education: N/A   Occupational History  . Not on file.   Social History Main Topics  . Smoking status: Former Smoker    Quit date: 06/03/1988  . Smokeless tobacco: Never Used  . Alcohol use No  . Drug use: No     Comment: monthly  . Sexual activity: Yes     Comment: pt.  given condoms   Other Topics Concern  . Not on file   Social History Narrative  . No narrative on file    REVIEW OF SYSTEMS: Constitutional: No fevers, chills, or sweats, no generalized fatigue, change in appetite Eyes: No visual changes, double vision, eye pain Ear, nose and throat: No hearing loss, ear pain, nasal congestion, sore throat Cardiovascular: No chest pain, palpitations Respiratory:  No shortness of breath at rest or with exertion, wheezes GastrointestinaI: No nausea, vomiting, diarrhea, abdominal pain, fecal incontinence Genitourinary:  No dysuria, urinary retention or frequency Musculoskeletal:  No neck pain, back pain Integumentary: No rash, pruritus, skin lesions Neurological: as above Psychiatric: + depression,no insomnia, anxiety Endocrine: No palpitations, fatigue, diaphoresis, mood swings, change in appetite, change in weight, increased thirst Hematologic/Lymphatic:  No anemia, purpura, petechiae. Allergic/Immunologic: no itchy/runny eyes, nasal congestion, recent allergic reactions, rashes  PHYSICAL EXAM: Vitals:   01/02/16 0855  BP: 110/74  Pulse: 60   General: No acute distress Head:  Normocephalic/atraumatic Eyes: Fundoscopic exam shows bilateral sharp discs, no vessel changes, exudates, or hemorrhages Neck: supple, no paraspinal tenderness, full range of motion Back: No paraspinal tenderness Heart: regular rate and rhythm Lungs: Clear to auscultation bilaterally. Vascular: No carotid bruits. Skin/Extremities: No rash, no edema Neurological Exam: Mental status: alert and oriented to person, place, and time, no dysarthria or aphasia, Fund of knowledge is appropriate.  Recent and remote memory are intact.  Attention and concentration are normal.    Able to name objects and repeat phrases.  Montreal Cognitive Assessment  01/02/2016  Visuospatial/ Executive (0/5) 5  Naming (0/3) 3  Attention: Read list of digits (0/2) 2  Attention: Read list of letters  (0/1) 1  Attention: Serial 7 subtraction starting at 100 (0/3) 2  Language: Repeat phrase (0/2) 2  Language : Fluency (0/1) 0  Abstraction (0/2) 2  Delayed Recall (0/5) 4  Orientation (0/6) 5  Total 26  Adjusted Score (based on education) 27   Cranial nerves: CN I: not tested CN II: pupils equal, round and reactive to light, visual fields intact, fundi unremarkable. CN III, IV, VI:  full range of motion, no nystagmus, no ptosis CN V: facial sensation intact CN VII: upper and lower face symmetric CN VIII: hearing intact to finger rub CN IX, X: gag intact, uvula midline CN XI: sternocleidomastoid and trapezius muscles intact CN XII: tongue midline Bulk & Tone: normal, no fasciculations. Motor: 5/5 throughout with no pronator drift. Sensation: intact to light touch, cold, pin, vibration and joint position sense.  No extinction to double simultaneous stimulation.  Romberg test negative Deep Tendon Reflexes: +2 throughout, no ankle clonus Plantar responses: downgoing bilaterally Cerebellar: no incoordination on finger to nose, heel to shin. No dysdiadochokinesia Gait: narrow-based and steady, able to tandem walk adequately. Tremor: none  IMPRESSION: This is a pleasant 50 year old left-handed man with a history of HIV, hepatitis C, syphilis, presenting  for evaluation of cognitive changes. His neurological exam is normal, MOCA score normal 27/30 (nl > 26/30). We discussed different causes of memory changes, TSH and B12 normal. 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 His therapist expressed concern that symptoms are due to a medical issue rather than psychiatric one. With history of HIV, an MRI brain with and without contrast will be ordered to assess for underlying structural abnormality. No clear indication for LP at this time. He is agreeable to seeing a psychiatrist for  treatment of depression, continue with psychotherapy. Our office will call his with MRI results, if normal, follow-up on a prn basis.   Thank you for allowing me to participate in the care of this patient. Please do not hesitate to call for any questions or concerns.   Ellouise Newer, M.D.  CC: Dr. Linus Salmons

## 2016-01-17 ENCOUNTER — Encounter: Payer: Self-pay | Admitting: Internal Medicine

## 2016-02-12 ENCOUNTER — Ambulatory Visit
Admission: RE | Admit: 2016-02-12 | Discharge: 2016-02-12 | Disposition: A | Discharge status: Home / Self Care | Payer: BLUE CROSS/BLUE SHIELD | Source: Ambulatory Visit | Attending: Neurology | Admitting: Neurology

## 2016-02-12 DIAGNOSIS — R413 Other amnesia: Secondary | ICD-10-CM | POA: Diagnosis not present

## 2016-02-12 DIAGNOSIS — B20 Human immunodeficiency virus [HIV] disease: Secondary | ICD-10-CM

## 2016-02-12 MED ORDER — GADOBENATE DIMEGLUMINE 529 MG/ML IV SOLN
15.0000 mL | Freq: Once | INTRAVENOUS | Status: AC | PRN
  Administered 2016-02-12: 15 mL via INTRAVENOUS

## 2016-02-20 ENCOUNTER — Ambulatory Visit (HOSPITAL_COMMUNITY)
Admission: EM | Admit: 2016-02-20 | Discharge: 2016-02-20 | Disposition: A | Discharge status: Home / Self Care | Payer: BLUE CROSS/BLUE SHIELD | Attending: Family Medicine | Admitting: Family Medicine

## 2016-02-20 ENCOUNTER — Encounter (HOSPITAL_COMMUNITY): Payer: Self-pay | Admitting: *Deleted

## 2016-02-20 DIAGNOSIS — K047 Periapical abscess without sinus: Secondary | ICD-10-CM | POA: Diagnosis not present

## 2016-02-20 MED ORDER — CLINDAMYCIN HCL 300 MG PO CAPS
300.0000 mg | ORAL_CAPSULE | Freq: Three times a day (TID) | ORAL | 0 refills | Status: DC
Start: 2016-02-20 — End: 2016-07-11

## 2016-02-20 MED ORDER — DICLOFENAC POTASSIUM 50 MG PO TABS: 50.0000 mg | ORAL_TABLET | Freq: Three times a day (TID) | ORAL | 0 refills | Status: DC

## 2016-02-20 NOTE — ED Provider Notes (Signed)
[facility]    CSN: [id number] Arrival date & time: 02/20/16  1136  First Provider Contact:  None       History   Chief Complaint Chief Complaint  Patient presents with  . Dental Pain    HPI Erik Bautista is a 50 y.o. male.   The history is provided by the patient.  Dental Pain  Location:  Lower Lower teeth location:  30/RL 1st molar Quality:  Throbbing Severity:  Moderate Onset quality:  Sudden Duration:  1 day Progression:  Worsening Chronicity:  New Context: abscess, dental caries and poor dentition   Relieved by:  None tried Worsened by:  Nothing Ineffective treatments:  None tried Associated symptoms: facial swelling   Associated symptoms: no congestion and no fever   Risk factors: lack of dental care and periodontal disease     Past Medical History:  Diagnosis Date  . HIV infection ([place])   . Hyperlipidemia     Patient Active Problem List   Diagnosis Date Noted  . Memory changes 01/02/2016  . Depression 01/02/2016  . Attention or concentration deficit 11/29/2015  . Finger laceration 05/04/2015  . Hepatitis C infection 07/06/2012  . Transaminitis 04/28/2012  . SKIN DISORDER 05/09/2009  . 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 ALLERGIC RHINITIS 02/10/2007  . Human immunodeficiency virus (HIV) disease ([place]) 12/25/2006    Past Surgical History:  Procedure Laterality Date  . WRIST ARTHROPLASTY         Home Medications    Prior to Admission medications   Medication Sig Start Date End Date Taking? Authorizing Provider  acetaminophen (TYLENOL) 500 MG tablet Take 1 tablet (500 mg total) by mouth every 6 (six) hours as needed. 04/20/15   Gloriann Loan, PA-C  Darunavir Ethanolate (PREZISTA) 800 MG tablet Take 1 tablet (800 mg total) by mouth daily with breakfast. 11/29/15   Thayer Headings, MD  emtricitabine-tenofovir AF (DESCOVY) 200-25 MG tablet Take 1 tablet by mouth daily. 11/29/15   Thayer Headings, MD  ritonavir (NORVIR) 100 MG TABS tablet  Take 1 tablet (100 mg total) by mouth daily. 11/29/15   Thayer Headings, MD    Family History History reviewed. No pertinent family history.  Social History Social History  Substance Use Topics  . Smoking status: Former Smoker    Quit date: 06/03/1988  . Smokeless tobacco: Never Used  . Alcohol use No     Allergies   Review of patient's allergies indicates no known allergies.   Review of Systems Review of Systems  Constitutional: Negative.  Negative for fever.  HENT: Positive for dental problem and facial swelling. Negative for congestion.   All other systems reviewed and are negative.    Physical Exam Triage Vital Signs ED Triage Vitals [02/20/16 1341]  Enc Vitals Group     BP 142/78     Pulse Rate 78     Resp 18     Temp 98.6 F (37 C)     Temp Source Oral     SpO2 100 %     Weight      Height      Head Circumference      Peak Flow      Pain Score      Pain Loc      Pain Edu?      Excl. in [place]?    No data found.   Updated Vital Signs BP 142/78 (BP Location: Right Arm)   Pulse 78   Temp 98.6  F (37 C) (Oral)   Resp 18   SpO2 100%   Visual Acuity Right Eye Distance:   Left Eye Distance:   Bilateral Distance:    Right Eye Near:   Left Eye Near:    Bilateral Near:     Physical Exam  Constitutional: He is oriented to person, place, and time. He appears well-developed and well-nourished.  HENT:  Right Ear: External ear normal.  Left Ear: External ear normal.  Mouth/Throat: Abnormal dentition. Dental abscesses and dental caries present.    Neck: Normal range of motion. Neck supple.  Lymphadenopathy:    He has no cervical adenopathy.  Neurological: He is alert and oriented to person, place, and time.  Skin: Skin is warm and dry.  Nursing note and vitals reviewed.    UC Treatments / Results  Labs (all labs ordered are listed, but only abnormal results are displayed) Labs Reviewed - No data to display  EKG  EKG Interpretation None        Radiology No results found.  Procedures Procedures (including critical care time)  Medications Ordered in UC Medications - No data to display   Initial Impression / Assessment and Plan / UC Course  I have reviewed the triage vital signs and the nursing notes.  Pertinent labs & imaging results that were available during my care of the patient were reviewed by me and considered in my medical decision making (see chart for details).  Clinical Course     Final Clinical Impressions(s) / UC Diagnoses   Final diagnoses:  None    New Prescriptions New Prescriptions   No medications on file     Billy Fischer, MD 03/08/16 2023

## 2016-02-20 NOTE — ED Triage Notes (Signed)
Pt  Reports  A  Toothache    With  Swelling   And  Pain  r   Side  Of face    With  Onset of symptoms      Since  Last pm         Pt  Is  Sitting  Upright on the  Exam table  Speaking  In complete  sentances     In no  Severe  Distress

## 2016-02-20 NOTE — Discharge Instructions (Signed)
Take medicine as prescribed, see your dentist as soon as possible °

## 2016-03-05 ENCOUNTER — Encounter: Payer: Self-pay | Admitting: Internal Medicine

## 2016-03-10 ENCOUNTER — Encounter: Payer: Self-pay | Admitting: Neurology

## 2016-03-13 ENCOUNTER — Other Ambulatory Visit: Payer: Self-pay

## 2016-03-13 DIAGNOSIS — F3289 Other specified depressive episodes: Secondary | ICD-10-CM

## 2016-04-15 ENCOUNTER — Ambulatory Visit (INDEPENDENT_AMBULATORY_CARE_PROVIDER_SITE_OTHER): Payer: BLUE CROSS/BLUE SHIELD | Admitting: Licensed Clinical Social Worker

## 2016-04-15 DIAGNOSIS — F331 Major depressive disorder, recurrent, moderate: Secondary | ICD-10-CM | POA: Diagnosis not present

## 2016-04-16 ENCOUNTER — Ambulatory Visit: Payer: BLUE CROSS/BLUE SHIELD | Admitting: Licensed Clinical Social Worker

## 2016-05-07 ENCOUNTER — Ambulatory Visit: Payer: BLUE CROSS/BLUE SHIELD | Admitting: Licensed Clinical Social Worker

## 2016-06-04 ENCOUNTER — Encounter (INDEPENDENT_AMBULATORY_CARE_PROVIDER_SITE_OTHER): Payer: BLUE CROSS/BLUE SHIELD | Admitting: *Deleted

## 2016-06-04 VITALS — BP 111/71 | HR 51 | Temp 98.3°F | Wt 187.0 lb

## 2016-06-04 DIAGNOSIS — Z006 Encounter for examination for normal comparison and control in clinical research program: Secondary | ICD-10-CM

## 2016-06-04 NOTE — Progress Notes (Signed)
Erik Bautista is here for his week 28 Study visit for The HAILO Study: A Long Term follow-up of Older HIV-Infected Adults in the ACTG, an observational study addressing the issues of aging, HIV infection and Inflammation  He denies any new problems but did have some dental work done for an abcess in September. He said he had been to see a psychiatrist for depression but doesn't want to go back to see her. She had recommended he start an antidepressant. He did say he would see Grayland Ormond in the future when he felt the need for counseling. He will return in July for the next study visit and has an appt. Scheduled for Dr. Linus Salmons in February.

## 2016-06-22 LAB — HIV-1 RNA QUANT-NO REFLEX-BLD: HIV-1 RNA Viral Load: <40

## 2016-06-29 ENCOUNTER — Other Ambulatory Visit: Payer: Self-pay | Admitting: Internal Medicine

## 2016-06-29 DIAGNOSIS — B20 Human immunodeficiency virus [HIV] disease: Secondary | ICD-10-CM

## 2016-06-29 DIAGNOSIS — R41840 Attention and concentration deficit: Secondary | ICD-10-CM

## 2016-06-30 ENCOUNTER — Other Ambulatory Visit: Payer: Self-pay | Admitting: Internal Medicine

## 2016-06-30 DIAGNOSIS — R41840 Attention and concentration deficit: Secondary | ICD-10-CM

## 2016-07-11 ENCOUNTER — Ambulatory Visit (INDEPENDENT_AMBULATORY_CARE_PROVIDER_SITE_OTHER): Payer: BLUE CROSS/BLUE SHIELD | Admitting: Internal Medicine

## 2016-07-11 ENCOUNTER — Encounter: Payer: Self-pay | Admitting: Internal Medicine

## 2016-07-11 DIAGNOSIS — R635 Abnormal weight gain: Secondary | ICD-10-CM

## 2016-07-11 DIAGNOSIS — B20 Human immunodeficiency virus [HIV] disease: Secondary | ICD-10-CM

## 2016-07-11 DIAGNOSIS — Z5181 Encounter for therapeutic drug level monitoring: Secondary | ICD-10-CM

## 2016-07-11 DIAGNOSIS — F321 Major depressive disorder, single episode, moderate: Secondary | ICD-10-CM

## 2016-07-12 ENCOUNTER — Encounter: Payer: Self-pay | Admitting: *Deleted

## 2016-07-12 DIAGNOSIS — R635 Abnormal weight gain: Secondary | ICD-10-CM | POA: Insufficient documentation

## 2016-07-12 DIAGNOSIS — Z5181 Encounter for therapeutic drug level monitoring: Secondary | ICD-10-CM | POA: Insufficient documentation

## 2016-07-12 NOTE — Progress Notes (Signed)
   Subjective:    Patient ID: Erik Bautista, male    DOB: [birth year], 51 y.o.   MRN: [id number]  HPI Here for follow up of HIV and concentration.   He continues to do well with prezista, norvir and descovy and no missed doses. Labs done with research and have remained good with viral suppression.  No associated n/v/d. He continues to have issues with concentration and saw neurology.  Note from neurology revieiwed, no neurologic issues identified and is felt more related to depression. He did go see a psychiatrist once but did not feel his issues were going to be adequately addressed so did not go back.  It was recommended to him to start an SSRI but that he would need to get it from his PCP.  He does sleep a lot.  Also wakes up hungry at night and stopped exercising about 2 years ago, he feels likely due to depression.  Is interested in seeing our counselor. Also with some recent weight gain.    Review of Systems  Constitutional: Positive for fatigue. Negative for unexpected weight change.  Gastrointestinal: Negative for abdominal pain.  Skin: Negative for rash.  Neurological: Negative for dizziness and headaches.  Psychiatric/Behavioral: Positive for confusion, dysphoric mood and sleep disturbance. Negative for suicidal ideas.       Objective:   Physical Exam  Constitutional: He appears well-developed and well-nourished.  Eyes: No scleral icterus.  Cardiovascular: Normal rate, regular rhythm and normal heart sounds.   No murmur heard. Pulmonary/Chest: Effort normal.  Lymphadenopathy:    He has no cervical adenopathy.  Skin: No rash noted.  Psychiatric: He has a normal mood and affect. His behavior is normal.   SH: no tobacco       Assessment & Plan:

## 2016-07-12 NOTE — Assessment & Plan Note (Signed)
Doing well on his regimen.  We did discuss newer regimens and I would like to consider Biktarvy when it is on the formulary. Will continue with his current regimen now.

## 2016-07-12 NOTE — Assessment & Plan Note (Signed)
I agree his symptoms are most c/w depression. He is going to schedule with our counselor.  I can prescribe SSRI if he is in with counseling and felt appropriate.  I also suggested restarting an exercise routine which he seemed interested in.

## 2016-07-12 NOTE — Assessment & Plan Note (Signed)
I think more related to recent decrease in activity with depression.  I suggested less carbs and more protein, increase exercise.

## 2016-07-16 ENCOUNTER — Ambulatory Visit: Payer: BLUE CROSS/BLUE SHIELD

## 2016-07-16 DIAGNOSIS — F4323 Adjustment disorder with mixed anxiety and depressed mood: Secondary | ICD-10-CM

## 2016-07-17 NOTE — BH Specialist Note (Signed)
I met with Erik Bautista for the first time since last May and he reports similar symptoms as then - he sleeps a lot on his days off and has no energy or motivation at times. I assessed him for depression and found that though his mood is down once in a while, for the most part he enjoys his life. No significant crying spells, no loss of interest in things. He does avoid socialization, but that sounds more like social phobia or just that he prefers to be alone. He enjoys painting at home, watching movies and shows on Netflix, playing with his cats, etc. We talked about diet and exercise to increase his energy level and he agreed to begin making some changes to see if this will help. Plan to meet again in 2 weeks. [name], LCSW

## 2016-07-30 ENCOUNTER — Ambulatory Visit: Payer: BLUE CROSS/BLUE SHIELD

## 2016-07-30 DIAGNOSIS — F4323 Adjustment disorder with mixed anxiety and depressed mood: Secondary | ICD-10-CM

## 2016-07-30 NOTE — BH Specialist Note (Signed)
Erik Bautista was pleasant today and said that he seems to be doing some better after coming in and talking last time. 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 He showed me a drawing he is doing as a mural on his wall and it was very impressive. He also talked about stopping drug use 2 years ago. Plan to meet again in 2 weeks. [name], LCSW

## 2016-08-02 ENCOUNTER — Other Ambulatory Visit: Payer: Self-pay | Admitting: Internal Medicine

## 2016-08-02 DIAGNOSIS — R41840 Attention and concentration deficit: Secondary | ICD-10-CM

## 2016-08-13 ENCOUNTER — Encounter: Payer: Self-pay | Admitting: Sports Medicine

## 2016-08-13 ENCOUNTER — Ambulatory Visit (INDEPENDENT_AMBULATORY_CARE_PROVIDER_SITE_OTHER): Payer: BLUE CROSS/BLUE SHIELD | Admitting: Sports Medicine

## 2016-08-13 ENCOUNTER — Ambulatory Visit: Payer: BLUE CROSS/BLUE SHIELD

## 2016-08-13 ENCOUNTER — Ambulatory Visit
Admission: RE | Admit: 2016-08-13 | Discharge: 2016-08-13 | Disposition: A | Discharge status: Home / Self Care | Payer: BLUE CROSS/BLUE SHIELD | Source: Ambulatory Visit | Attending: Sports Medicine | Admitting: Sports Medicine

## 2016-08-13 VITALS — BP 127/72 | HR 64 | Ht 70.0 in | Wt 190.0 lb

## 2016-08-13 DIAGNOSIS — F4310 Post-traumatic stress disorder, unspecified: Secondary | ICD-10-CM

## 2016-08-13 DIAGNOSIS — M79601 Pain in right arm: Secondary | ICD-10-CM

## 2016-08-13 DIAGNOSIS — M25521 Pain in right elbow: Secondary | ICD-10-CM | POA: Diagnosis not present

## 2016-08-13 DIAGNOSIS — M47812 Spondylosis without myelopathy or radiculopathy, cervical region: Secondary | ICD-10-CM | POA: Diagnosis not present

## 2016-08-13 MED ORDER — GABAPENTIN 300 MG PO CAPS: 300.0000 mg | ORAL_CAPSULE | Freq: Every day | ORAL | 1 refills | Status: DC

## 2016-08-13 MED ORDER — PREDNISONE 10 MG PO TABS: ORAL_TABLET | ORAL | 0 refills | Status: DC

## 2016-08-13 NOTE — Progress Notes (Signed)
  Erik Bautista - 51 y.o. male MRN [id number]  Date of birth: Dec 13, 1965  SUBJECTIVE:  Including CC & ROS.   Erik Bautista is a 51 yo male that is presenting with right arm pain. He reports that 3-4 months ago he fell and landed forcibly on his right lateral elbow. Since that time he has had some intermittent pain seems to be getting worse as of late. He has pain on the anterior aspect of his forearm as well as the palmar surface of his hand. He describes it as burning. He has to move his hand in the morning to get it working. 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 He feels like his right hand is weaker than his left hand. He denies any swelling or bruising when it first occurred. He denies any medication use.  ROS: No unexpected weight loss, fever, chills, swelling, instability, numbness/tingling, redness, otherwise see HPI    HISTORY: Past Medical, Surgical, Social, and Family History Reviewed & Updated per EMR.   Pertinent Historical Findings include: PMSHx -  HIV, left wrist surgery PSHx -  no tobacco or alcohol use FHx -  none  DATA REVIEWED: None to review   PHYSICAL EXAM:  VS: BP:127/72  HR:64bpm  TEMP: ( )  RESP:   HT:5\' 10"  (177.8 cm)   WT:190 lb (86.2 kg)  BMI:27.3 PHYSICAL EXAM: Gen: NAD, alert, cooperative with exam, well-appearing HEENT: clear conjunctiva, EOMI CV:  no edema, capillary refill brisk,  Resp: non-labored, normal speech Skin: no rashes, normal turgor  Neuro: no gross deficits.  Psych:  alert and oriented Right elbow:  No tenderness to palpation of the medial lateral epicondyle. No tenderness to palpation of the olecranon. No tenderness to palpation over the carpal bones Normal elbow range of motion. Normal wrist range of motion. Normal strength with finger abduction and abduction. Normal pincer grasp. Weaker grip strength on the right compared to the left. Negative Tinel's at rest. Negative Finklestein's  . Neurovascular intact.   ASSESSMENT & PLAN:   Right arm pain The pain seems to be neurogenic in nature. This could either be coming from the elbow or having the jarring of the fall and resulting from an origin from his neck. Demonstrate some weakness in grip strength. No obvious deformities on exam. - X-ray of his elbow as well as a cervical spine - Initiate prednisone and gabapentin daily at bedtime - He'll follow-up in 3 weeks, if there is no improvement at that time may need to do a nerve conduction study.

## 2016-08-13 NOTE — BH Specialist Note (Signed)
Erik Bautista talked about how he has trouble focusing on people when they talk to him and it is a problem, especially when this happens at work. I began asking a line of questions and discovered he has a history of trauma and likely has PTSD or at least many of the symptoms of it. I taught him the basics of mindfulness meditation, which he responded well to and said he will try it at home. Plan to meet again in 2 weeks. [name], LCSW

## 2016-08-13 NOTE — Assessment & Plan Note (Signed)
The pain seems to be neurogenic in nature. This could either be coming from the elbow or having the jarring of the fall and resulting from an origin from his neck. Demonstrate some weakness in grip strength. No obvious deformities on exam. - X-ray of his elbow as well as a cervical spine - Initiate prednisone and gabapentin daily at bedtime - He'll follow-up in 3 weeks, if there is no improvement at that time may need to do a nerve conduction study.

## 2016-08-21 ENCOUNTER — Other Ambulatory Visit: Payer: Self-pay | Admitting: *Deleted

## 2016-08-21 ENCOUNTER — Telehealth: Payer: Self-pay | Admitting: Family Medicine

## 2016-08-21 DIAGNOSIS — R29898 Other symptoms and signs involving the musculoskeletal system: Secondary | ICD-10-CM

## 2016-08-21 DIAGNOSIS — R9389 Abnormal findings on diagnostic imaging of other specified body structures: Secondary | ICD-10-CM

## 2016-08-21 NOTE — Telephone Encounter (Signed)
Left VM for patient. If he calls back please have him speak with a nurse/CMA and inform that will be obtaining an MRI for his neck after we found possible facet malalignment at C6-C7 with possible perched appearance of the facets at this level.   If any questions then please take the best time and phone number to call and I will try to call him back.   Rosemarie Ax, MD PGY-4, [facility] 08/21/2016, 1:38 PM

## 2016-08-27 ENCOUNTER — Ambulatory Visit: Payer: BLUE CROSS/BLUE SHIELD

## 2016-08-27 DIAGNOSIS — F4320 Adjustment disorder, unspecified: Secondary | ICD-10-CM

## 2016-08-27 NOTE — BH Specialist Note (Signed)
Erik Bautista reports he has been feeling some better lately. He said he fell a few days ago and his his elbow, so he went to the doctor and he prescribed Prednisone. He said he felt good and had more energy after taking the Prednisone. He said he is also trying meditation, but can't tell if it helps. I provided psycho-education on belly breathing and slowing down his exhale. Plan to meet in 2 weeks. [name], LCSW

## 2016-09-01 ENCOUNTER — Ambulatory Visit
Admission: RE | Admit: 2016-09-01 | Discharge: 2016-09-01 | Disposition: A | Discharge status: Home / Self Care | Payer: BLUE CROSS/BLUE SHIELD | Source: Ambulatory Visit | Attending: Sports Medicine | Admitting: Sports Medicine

## 2016-09-01 DIAGNOSIS — R9389 Abnormal findings on diagnostic imaging of other specified body structures: Secondary | ICD-10-CM

## 2016-09-01 DIAGNOSIS — R29898 Other symptoms and signs involving the musculoskeletal system: Secondary | ICD-10-CM

## 2016-09-01 DIAGNOSIS — M4802 Spinal stenosis, cervical region: Secondary | ICD-10-CM | POA: Diagnosis not present

## 2016-09-10 ENCOUNTER — Ambulatory Visit (INDEPENDENT_AMBULATORY_CARE_PROVIDER_SITE_OTHER): Payer: BLUE CROSS/BLUE SHIELD | Admitting: Sports Medicine

## 2016-09-10 ENCOUNTER — Ambulatory Visit (INDEPENDENT_AMBULATORY_CARE_PROVIDER_SITE_OTHER): Payer: BLUE CROSS/BLUE SHIELD

## 2016-09-10 ENCOUNTER — Encounter: Payer: Self-pay | Admitting: Sports Medicine

## 2016-09-10 VITALS — BP 123/80 | HR 72 | Ht 70.0 in | Wt 190.0 lb

## 2016-09-10 DIAGNOSIS — M4802 Spinal stenosis, cervical region: Secondary | ICD-10-CM

## 2016-09-10 DIAGNOSIS — F331 Major depressive disorder, recurrent, moderate: Secondary | ICD-10-CM

## 2016-09-10 MED ORDER — DICLOFENAC SODIUM 75 MG PO TBEC: 75.0000 mg | DELAYED_RELEASE_TABLET | Freq: Two times a day (BID) | ORAL | 0 refills | Status: DC

## 2016-09-10 NOTE — BH Specialist Note (Signed)
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 She is angry that he has a job above her and he is [place]. She talks in the break room, when he comes in, about the border wall that Trump wants to build between [place] and the [place]. He said he is having trouble sleeping at night because he thinks about this and gets angry. I suggested he join a gym and exercise to get his mind off of it. I also suggested using guided meditations on anger management online. He agreed to try this. I also gave him the support group schedule for [facility] [employer] as well as for [place] [facility]. Plan to meet again in 2 weeks. [name], LCSW

## 2016-09-11 NOTE — Progress Notes (Signed)
   Subjective:    Patient ID: Erik Bautista, male    DOB: [birth year], 51 y.o.   MRN: [id number]  HPI   Patient comes in today for follow-up on right arm pain, numbness, and weakness. Overall, his symptoms have improved after a 6 day Sterapred Dosepak. His pain has improved dramatically but he still notes some numbness into his forearm and his hand. He still notices weakness with the arm as well but not as severe as it was previously. He was not able to take the gabapentin due to drowsiness.    Review of Systems As above    Objective:   Physical Exam  Well-developed, well-nourished. No acute distress. Awake alert and oriented 3. Vital signs reviewed  Cervical spine: Full painless cervical range of motion.  Neurological exam: No noticeable atrophy of either upper extremity. Strength is 5/5 in both upper extremities. Reflexes appear to be equal at the biceps, triceps, and brachial radialis tendons bilaterally. Slightly decreased grip strength on the right compared to the left but it has improved since his last visit. Sensation is intact to light touch grossly.  X-rays of his right elbow are unremarkable. X-rays of the cervical spine were done which demonstrated reversal of the cervical lordosis with multilevel moderate to severe degenerative disc disease. There was also a question of facet malalignment so an MRI was ordered to evaluate further. The MRI did not confirm any malalignment but does demonstrate moderate spinal stenosis and moderate to severe right neural foraminal stenosis at C5-C6. He has mild to moderate degenerative changes at other levels as well.      Assessment & Plan:   Right arm pain, numbness, and weakness secondary to C5-C6 spinal/foraminal stenosis  Patient symptoms are definitely better but he still has some slight weakness on the right. Given his MRI findings I recommend consultation with Dr. Ellene Route. In the meantime, I've given him a prescription for diclofenac 75  mg to take twice daily with food as needed. I will defer further workup and treatment to the discretion of Dr. Ellene Route and the patient will follow-up with me as needed.

## 2016-09-24 ENCOUNTER — Ambulatory Visit: Payer: Self-pay

## 2016-10-24 DIAGNOSIS — M4712 Other spondylosis with myelopathy, cervical region: Secondary | ICD-10-CM | POA: Diagnosis not present

## 2016-11-01 ENCOUNTER — Encounter: Payer: Self-pay | Admitting: Neurology

## 2016-11-13 DIAGNOSIS — H2513 Age-related nuclear cataract, bilateral: Secondary | ICD-10-CM | POA: Diagnosis not present

## 2016-11-13 DIAGNOSIS — H25013 Cortical age-related cataract, bilateral: Secondary | ICD-10-CM | POA: Diagnosis not present

## 2016-11-13 DIAGNOSIS — B20 Human immunodeficiency virus [HIV] disease: Secondary | ICD-10-CM | POA: Diagnosis not present

## 2016-11-13 DIAGNOSIS — H40013 Open angle with borderline findings, low risk, bilateral: Secondary | ICD-10-CM | POA: Diagnosis not present

## 2016-12-12 ENCOUNTER — Encounter (INDEPENDENT_AMBULATORY_CARE_PROVIDER_SITE_OTHER): Payer: BLUE CROSS/BLUE SHIELD | Admitting: *Deleted

## 2016-12-12 VITALS — BP 113/75 | HR 48 | Temp 98.1°F | Ht 68.5 in | Wt 187.5 lb

## 2016-12-12 DIAGNOSIS — Z006 Encounter for examination for normal comparison and control in clinical research program: Secondary | ICD-10-CM

## 2016-12-12 LAB — LIPID PANEL
CHOL/HDL RATIO: 6.1 ratio — AB (ref <5.0)
CHOLESTEROL: 294 mg/dL — AB (ref <200)
HDL: 48 mg/dL (ref >40)
LDL Cholesterol: 200 mg/dL — ABNORMAL HIGH (ref <100)
TRIGLYCERIDES: 232 mg/dL — AB (ref <150)
VLDL: 46 mg/dL — ABNORMAL HIGH (ref <30)

## 2016-12-12 LAB — COMPREHENSIVE METABOLIC PANEL
ALBUMIN: 4.4 g/dL (ref 3.6–5.1)
ALK PHOS: 73 U/L (ref 40–115)
ALT: 16 U/L (ref 9–46)
AST: 16 U/L (ref 10–35)
BUN: 16 mg/dL (ref 7–25)
CO2: 25 mmol/L (ref 20–31)
CREATININE: 1.19 mg/dL (ref 0.70–1.33)
Calcium: 9.3 mg/dL (ref 8.6–10.3)
Chloride: 104 mmol/L (ref 98–110)
Glucose, Bld: 79 mg/dL (ref 65–99)
POTASSIUM: 4 mmol/L (ref 3.5–5.3)
Sodium: 137 mmol/L (ref 135–146)
TOTAL PROTEIN: 7.3 g/dL (ref 6.1–8.1)
Total Bilirubin: 0.6 mg/dL (ref 0.2–1.2)

## 2016-12-12 LAB — CD4/CD8 (T-HELPER/T-SUPPRESSOR CELL)
CD4 Count: 821
CD4 T CELL HELPER: 39.1
CD8 T CELL SUPPRESSOR: 40.3
CD8 T Cell Abs: 846

## 2016-12-12 NOTE — Progress Notes (Signed)
Erik Bautista is here for his week 240 visit for The HAILO Study: A Long Term follow-up of Older HIV-Infected Adults in the ACTG, an observational study addressing the issues of aging, HIV infection and Inflammation. He says he is going to have back surgery in August. He has been having numbness, pain in his rt arm ever since he had a fall in the Spring and when he went to have it checked out, they found an area on his cervical spine that was stenotic and causing the problem. He denies any other problems or any new medications currently. He is scheduled to see Dr. Linus Salmons in August. He will return for study in December. We did get most of his labs for study that he would need for Dr. Henreitta Leber visit.

## 2016-12-13 ENCOUNTER — Ambulatory Visit: Payer: Self-pay | Admitting: Neurology

## 2016-12-13 LAB — CREATININE, URINE, RANDOM: Creatinine, Urine: 186 mg/dL (ref 20–370)

## 2016-12-13 LAB — PROTEIN, URINE, RANDOM: Total Protein, Urine: 13 mg/dL (ref 5–25)

## 2016-12-13 LAB — HEMOGLOBIN A1C
Hgb A1c MFr Bld: 5.4 % (ref <5.7)
Mean Plasma Glucose: 108 mg/dL

## 2016-12-19 LAB — HIV-1 RNA QUANT-NO REFLEX-BLD: HIV-1 RNA Viral Load: <40

## 2016-12-23 ENCOUNTER — Other Ambulatory Visit: Payer: Self-pay | Admitting: Internal Medicine

## 2016-12-23 DIAGNOSIS — B20 Human immunodeficiency virus [HIV] disease: Secondary | ICD-10-CM

## 2016-12-23 DIAGNOSIS — R41840 Attention and concentration deficit: Secondary | ICD-10-CM

## 2017-01-09 ENCOUNTER — Encounter: Payer: Self-pay | Admitting: *Deleted

## 2017-01-16 ENCOUNTER — Ambulatory Visit: Payer: Self-pay | Admitting: Internal Medicine

## 2017-02-11 DIAGNOSIS — M50122 Cervical disc disorder at C5-C6 level with radiculopathy: Secondary | ICD-10-CM | POA: Diagnosis not present

## 2017-02-11 DIAGNOSIS — M50022 Cervical disc disorder at C5-C6 level with myelopathy: Secondary | ICD-10-CM | POA: Diagnosis not present

## 2017-02-11 DIAGNOSIS — M4712 Other spondylosis with myelopathy, cervical region: Secondary | ICD-10-CM | POA: Diagnosis not present

## 2017-02-11 DIAGNOSIS — M50222 Other cervical disc displacement at C5-C6 level: Secondary | ICD-10-CM | POA: Diagnosis not present

## 2017-02-11 DIAGNOSIS — M50123 Cervical disc disorder at C6-C7 level with radiculopathy: Secondary | ICD-10-CM | POA: Diagnosis not present

## 2017-02-11 DIAGNOSIS — M50223 Other cervical disc displacement at C6-C7 level: Secondary | ICD-10-CM | POA: Diagnosis not present

## 2017-02-20 ENCOUNTER — Other Ambulatory Visit: Payer: Self-pay | Admitting: Internal Medicine

## 2017-02-20 DIAGNOSIS — B20 Human immunodeficiency virus [HIV] disease: Secondary | ICD-10-CM

## 2017-02-20 DIAGNOSIS — R41840 Attention and concentration deficit: Secondary | ICD-10-CM

## 2017-02-21 ENCOUNTER — Other Ambulatory Visit: Payer: Self-pay

## 2017-02-21 MED ORDER — GABAPENTIN 300 MG PO CAPS: 300.0000 mg | ORAL_CAPSULE | Freq: Every day | ORAL | 1 refills | Status: DC

## 2017-02-27 DIAGNOSIS — M4712 Other spondylosis with myelopathy, cervical region: Secondary | ICD-10-CM | POA: Diagnosis not present

## 2017-04-15 ENCOUNTER — Other Ambulatory Visit: Payer: Self-pay | Admitting: Sports Medicine

## 2017-05-07 ENCOUNTER — Encounter: Payer: Self-pay | Admitting: Internal Medicine

## 2017-05-07 ENCOUNTER — Encounter (INDEPENDENT_AMBULATORY_CARE_PROVIDER_SITE_OTHER): Payer: BLUE CROSS/BLUE SHIELD | Admitting: *Deleted

## 2017-05-07 VITALS — BP 108/69 | HR 59 | Temp 97.9°F | Wt 185.5 lb

## 2017-05-07 DIAGNOSIS — Z006 Encounter for examination for normal comparison and control in clinical research program: Secondary | ICD-10-CM

## 2017-05-07 NOTE — Progress Notes (Signed)
Erik Bautista is here for his week 49 visit for The HAILO Study: A Long Term follow-up of Older HIV-Infected Adults in the ACTG, an observational study addressing the issues of aging, HIV infection and Inflammation. He said he had outpatient surgery on his cervical neck to correct pinched nerves and that it has helped a lot. I am goin got ask Dr. Clarice Pole office top send [place] his medical records about the surgery. He has also been diagnosed with cataracts in both eyes and will have one of them operated on in January. He missed his appt. With Dr. Linus Salmons in August and is going to schedule another one soon. He will be returning for study in May at which time we will be getting all his labs.

## 2017-06-10 ENCOUNTER — Encounter: Payer: Self-pay | Admitting: Internal Medicine

## 2017-06-10 ENCOUNTER — Ambulatory Visit (INDEPENDENT_AMBULATORY_CARE_PROVIDER_SITE_OTHER): Payer: BLUE CROSS/BLUE SHIELD | Admitting: Internal Medicine

## 2017-06-10 DIAGNOSIS — B20 Human immunodeficiency virus [HIV] disease: Secondary | ICD-10-CM

## 2017-06-10 DIAGNOSIS — R413 Other amnesia: Secondary | ICD-10-CM

## 2017-06-10 MED ORDER — BICTEGRAVIR-EMTRICITAB-TENOFOV 50-200-25 MG PO TABS: 1.0000 | ORAL_TABLET | Freq: Every day | ORAL | 11 refills | Status: DC

## 2017-06-11 NOTE — Assessment & Plan Note (Signed)
This has improved.

## 2017-06-11 NOTE — Progress Notes (Signed)
   Subjective:    Patient ID: Erik Bautista, male    DOB: 1966-06-03, 52 y.o.   MRN: [id number]  HPI Here for follow up of HIV  He continues to do well with prezista, norvir and descovy and no missed doses. Labs done with research and have remained good with viral suppression.  No associated n/v/d. I last saw him about 1 year ago and since that time he had cervical surgery due to spinal stenosis with bilateral hand tingling and this has resolved.  He is getting cataract surgery later this month.  No missed doses.     Review of Systems  Constitutional: Negative for unexpected weight change.  Gastrointestinal: Negative for abdominal pain.  Skin: Negative for rash.  Neurological: Negative for dizziness and headaches.  Psychiatric/Behavioral: Negative for dysphoric mood.       Objective:   Physical Exam  Constitutional: He appears well-developed and well-nourished.  Eyes: No scleral icterus.  Cardiovascular: Normal rate, regular rhythm and normal heart sounds.  No murmur heard. Pulmonary/Chest: Effort normal.  Lymphadenopathy:    He has no cervical adenopathy.  Skin: No rash noted.  Psychiatric: He has a normal mood and affect.   SH: no tobacco       Assessment & Plan:

## 2017-06-11 NOTE — Assessment & Plan Note (Signed)
Doing well and will streamline his regimen to [facility].  rtc 6 months

## 2017-06-17 DIAGNOSIS — H18413 Arcus senilis, bilateral: Secondary | ICD-10-CM | POA: Diagnosis not present

## 2017-06-17 DIAGNOSIS — H2513 Age-related nuclear cataract, bilateral: Secondary | ICD-10-CM | POA: Diagnosis not present

## 2017-06-17 DIAGNOSIS — H25013 Cortical age-related cataract, bilateral: Secondary | ICD-10-CM | POA: Diagnosis not present

## 2017-06-17 DIAGNOSIS — H25043 Posterior subcapsular polar age-related cataract, bilateral: Secondary | ICD-10-CM | POA: Diagnosis not present

## 2017-06-18 ENCOUNTER — Encounter: Payer: Self-pay | Admitting: *Deleted

## 2017-06-18 LAB — HIV-1 RNA QUANT-NO REFLEX-BLD: HIV-1 RNA Viral Load: <40

## 2017-10-29 ENCOUNTER — Encounter: Payer: Self-pay | Admitting: *Deleted

## 2017-10-31 ENCOUNTER — Encounter (INDEPENDENT_AMBULATORY_CARE_PROVIDER_SITE_OTHER): Payer: Self-pay | Admitting: *Deleted

## 2017-10-31 VITALS — BP 113/74 | HR 55 | Temp 97.8°F | Wt 186.0 lb

## 2017-10-31 DIAGNOSIS — Z006 Encounter for examination for normal comparison and control in clinical research program: Secondary | ICD-10-CM

## 2017-10-31 NOTE — Progress Notes (Signed)
Erik Bautista is here for his week 50 visit for A5322, [name] the aging study. He denies any new problems currently. He says his depression is better since he now has 3 cats to keep him company at home. He still feels like he is having some memory issues and trouble concentrating. He will be returning in January for the next study visit.

## 2017-11-01 LAB — COMPREHENSIVE METABOLIC PANEL
AG Ratio: 1.5 (calc) (ref 1.0–2.5)
ALT: 20 U/L (ref 9–46)
AST: 15 U/L (ref 10–35)
Albumin: 4.3 g/dL (ref 3.6–5.1)
Alkaline phosphatase (APISO): 80 U/L (ref 40–115)
BUN: 15 mg/dL (ref 7–25)
CHLORIDE: 106 mmol/L (ref 98–110)
CO2: 24 mmol/L (ref 20–32)
CREATININE: 1.32 mg/dL (ref 0.70–1.33)
Calcium: 9.3 mg/dL (ref 8.6–10.3)
GLOBULIN: 2.8 g/dL (ref 1.9–3.7)
GLUCOSE: 102 mg/dL — AB (ref 65–99)
POTASSIUM: 4.1 mmol/L (ref 3.5–5.3)
Sodium: 140 mmol/L (ref 135–146)
TOTAL PROTEIN: 7.1 g/dL (ref 6.1–8.1)
Total Bilirubin: 0.8 mg/dL (ref 0.2–1.2)

## 2017-11-01 LAB — LIPID PANEL
CHOL/HDL RATIO: 4.6 (calc) (ref <5.0)
Cholesterol: 235 mg/dL — ABNORMAL HIGH (ref <200)
HDL: 51 mg/dL (ref >40)
LDL Cholesterol (Calc): 155 mg/dL (calc) — ABNORMAL HIGH
NON-HDL CHOLESTEROL (CALC): 184 mg/dL — AB (ref <130)
Triglycerides: 159 mg/dL — ABNORMAL HIGH (ref <150)

## 2017-11-01 LAB — PROTEIN / CREATININE RATIO, URINE
Creatinine, Urine: 145 mg/dL (ref 20–320)
PROTEIN/CREAT RATIO: 90 mg/g{creat} (ref 22–128)
TOTAL PROTEIN, URINE: 13 mg/dL (ref 5–25)

## 2017-11-01 LAB — HEMOGLOBIN A1C
HEMOGLOBIN A1C: 5.5 %{Hb} (ref <5.7)
Mean Plasma Glucose: 111 (calc)
eAG (mmol/L): 6.2 (calc)

## 2017-11-20 LAB — HIV-1 RNA QUANT-NO REFLEX-BLD
CD4 Count: 794
CD4%: 39.7
CD8 T CELL ABS: 748
CD8 T CELL SUPPRESSOR: 37.4

## 2017-11-26 ENCOUNTER — Encounter: Payer: Self-pay | Admitting: *Deleted

## 2018-05-14 ENCOUNTER — Telehealth: Payer: Self-pay

## 2018-05-14 ENCOUNTER — Other Ambulatory Visit: Payer: Self-pay | Admitting: Internal Medicine

## 2018-05-14 NOTE — Telephone Encounter (Signed)
Called patient after receiving refill request for Biktarvy. Patient never scheduled 6 month f/u appointment after being seen on 06/2017.  Spoke with patient who is able to schedule an appointment to return to care with our office. Patient would like to schedule an appointment for February after his appointment with research.  Erik Bautista

## 2018-06-10 ENCOUNTER — Encounter (INDEPENDENT_AMBULATORY_CARE_PROVIDER_SITE_OTHER): Payer: BLUE CROSS/BLUE SHIELD | Admitting: *Deleted

## 2018-06-10 ENCOUNTER — Ambulatory Visit (INDEPENDENT_AMBULATORY_CARE_PROVIDER_SITE_OTHER): Payer: BLUE CROSS/BLUE SHIELD | Admitting: *Deleted

## 2018-06-10 ENCOUNTER — Other Ambulatory Visit: Payer: BLUE CROSS/BLUE SHIELD

## 2018-06-10 VITALS — BP 108/72 | HR 55 | Temp 98.1°F | Wt 187.0 lb

## 2018-06-10 DIAGNOSIS — Z006 Encounter for examination for normal comparison and control in clinical research program: Secondary | ICD-10-CM

## 2018-06-10 DIAGNOSIS — Z21 Asymptomatic human immunodeficiency virus [HIV] infection status: Secondary | ICD-10-CM | POA: Diagnosis not present

## 2018-06-10 DIAGNOSIS — Z23 Encounter for immunization: Secondary | ICD-10-CM | POA: Diagnosis not present

## 2018-06-10 NOTE — Progress Notes (Unsigned)
cbc

## 2018-06-10 NOTE — Research (Signed)
Erik Bautista was here for his week 30 visit for the [place] study. He denies any new problems or medications. He was gven a flushot while here. I did tell him that the study may be ending at the next visit, but that they are tryint to get more funding for it to continue on an annual basis. He will be returning in July

## 2018-06-11 ENCOUNTER — Other Ambulatory Visit: Payer: Self-pay | Admitting: Internal Medicine

## 2018-06-11 LAB — CBC WITH DIFFERENTIAL/PLATELET
Absolute Monocytes: 528 cells/uL (ref 200–950)
BASOS ABS: 29 {cells}/uL (ref 0–200)
Basophils Relative: 0.5 %
EOS PCT: 2.9 %
Eosinophils Absolute: 168 cells/uL (ref 15–500)
HCT: 46.2 % (ref 38.5–50.0)
Hemoglobin: 16 g/dL (ref 13.2–17.1)
Lymphs Abs: 2192 cells/uL (ref 850–3900)
MCH: 29.9 pg (ref 27.0–33.0)
MCHC: 34.6 g/dL (ref 32.0–36.0)
MCV: 86.4 fL (ref 80.0–100.0)
MONOS PCT: 9.1 %
MPV: 9.9 fL (ref 7.5–12.5)
NEUTROS PCT: 49.7 %
Neutro Abs: 2883 cells/uL (ref 1500–7800)
PLATELETS: 353 10*3/uL (ref 140–400)
RBC: 5.35 10*6/uL (ref 4.20–5.80)
RDW: 13.4 % (ref 11.0–15.0)
Total Lymphocyte: 37.8 %
WBC: 5.8 10*3/uL (ref 3.8–10.8)

## 2018-06-11 LAB — COMPREHENSIVE METABOLIC PANEL
AG Ratio: 1.4 (calc) (ref 1.0–2.5)
ALBUMIN MSPROF: 4.6 g/dL (ref 3.6–5.1)
ALT: 20 U/L (ref 9–46)
AST: 16 U/L (ref 10–35)
Alkaline phosphatase (APISO): 79 U/L (ref 40–115)
BUN: 13 mg/dL (ref 7–25)
CO2: 27 mmol/L (ref 20–32)
Calcium: 9.9 mg/dL (ref 8.6–10.3)
Chloride: 103 mmol/L (ref 98–110)
Creat: 1.27 mg/dL (ref 0.70–1.33)
GLOBULIN: 3.2 g/dL (ref 1.9–3.7)
GLUCOSE: 84 mg/dL (ref 65–99)
Potassium: 4.9 mmol/L (ref 3.5–5.3)
SODIUM: 141 mmol/L (ref 135–146)
TOTAL PROTEIN: 7.8 g/dL (ref 6.1–8.1)
Total Bilirubin: 0.8 mg/dL (ref 0.2–1.2)

## 2018-06-11 LAB — T-HELPER CELL (CD4) - (RCID CLINIC ONLY)
CD4 % Helper T Cell: 34 % (ref 33–55)
CD4 T Cell Abs: 770 /uL (ref 400–2700)

## 2018-07-01 ENCOUNTER — Encounter: Payer: Self-pay | Admitting: *Deleted

## 2018-07-01 LAB — HIV-1 RNA QUANT-NO REFLEX-BLD: HIV-1 RNA Viral Load: <40

## 2018-07-02 LAB — HIV RNA, QUANTITATIVE, PCR: HIV 1 RNA Quant: NOT DETECTED copies/mL (ref ?–40)

## 2018-07-07 ENCOUNTER — Encounter: Payer: Self-pay | Admitting: Internal Medicine

## 2018-07-07 ENCOUNTER — Other Ambulatory Visit (HOSPITAL_COMMUNITY)
Admission: RE | Admit: 2018-07-07 | Discharge: 2018-07-07 | Disposition: A | Discharge status: Home / Self Care | Payer: BLUE CROSS/BLUE SHIELD | Source: Ambulatory Visit | Attending: Internal Medicine | Admitting: Internal Medicine

## 2018-07-07 ENCOUNTER — Ambulatory Visit (INDEPENDENT_AMBULATORY_CARE_PROVIDER_SITE_OTHER): Payer: BLUE CROSS/BLUE SHIELD | Admitting: Internal Medicine

## 2018-07-07 VITALS — BP 116/77 | HR 85 | Temp 97.6°F | Wt 189.0 lb

## 2018-07-07 DIAGNOSIS — Z5181 Encounter for therapeutic drug level monitoring: Secondary | ICD-10-CM

## 2018-07-07 DIAGNOSIS — Z113 Encounter for screening for infections with a predominantly sexual mode of transmission: Secondary | ICD-10-CM

## 2018-07-07 DIAGNOSIS — B20 Human immunodeficiency virus [HIV] disease: Secondary | ICD-10-CM

## 2018-07-07 DIAGNOSIS — Z23 Encounter for immunization: Secondary | ICD-10-CM

## 2018-07-07 NOTE — Progress Notes (Signed)
   Subjective:    Patient ID: Erik Bautista, male    DOB: 02-07-1966, 53 y.o.   MRN: [id number]  HPI Here for follow up of HIV  He continues to do well after changing to [facility] last year and no missed doses. Labs done with research and have remained good with viral suppression.  No associated n/v/d.  Feels well and no new issues.      Review of Systems  Constitutional: Negative for unexpected weight change.  Gastrointestinal: Negative for abdominal pain.  Skin: Negative for rash.  Neurological: Negative for dizziness and headaches.  Psychiatric/Behavioral: Negative for dysphoric mood.       Objective:   Physical Exam  Constitutional: He appears well-developed and well-nourished.  Eyes: No scleral icterus.  Cardiovascular: Normal rate, regular rhythm and normal heart sounds.  No murmur heard. Pulmonary/Chest: Effort normal.  Lymphadenopathy:    He has no cervical adenopathy.  Skin: No rash noted.  Psychiatric: He has a normal mood and affect.   SH: no tobacco       Assessment & Plan:

## 2018-07-07 NOTE — Assessment & Plan Note (Signed)
Creat, LFTs wnl.  

## 2018-07-07 NOTE — Addendum Note (Signed)
Addended by: Aundria Rud on: 07/07/2018 01:55 PM   Modules accepted: Orders

## 2018-07-07 NOTE — Assessment & Plan Note (Signed)
Doing well on Biktarvy and no changes.  rtc 1 year

## 2018-07-07 NOTE — Assessment & Plan Note (Signed)
Will screen today 

## 2018-07-08 ENCOUNTER — Other Ambulatory Visit: Payer: Self-pay | Admitting: Internal Medicine

## 2018-07-08 ENCOUNTER — Telehealth: Payer: Self-pay

## 2018-07-08 LAB — RPR TITER: RPR Titer: 1:4 {titer} — ABNORMAL HIGH

## 2018-07-08 LAB — URINE CYTOLOGY ANCILLARY ONLY
CHLAMYDIA, DNA PROBE: NEGATIVE
Neisseria Gonorrhea: NEGATIVE

## 2018-07-08 LAB — RPR: RPR: REACTIVE — AB

## 2018-07-08 LAB — FLUORESCENT TREPONEMAL AB(FTA)-IGG-BLD: FLUORESCENT TREPONEMAL ABS: REACTIVE — AB

## 2018-07-08 NOTE — Telephone Encounter (Signed)
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

## 2018-07-08 NOTE — Telephone Encounter (Signed)
Opened in error

## 2018-07-08 NOTE — Telephone Encounter (Signed)
Per Dr. Linus Salmons no treatment needed. See below.  Eugenia Mcalpine, LPN

## 2018-08-03 ENCOUNTER — Other Ambulatory Visit: Payer: Self-pay | Admitting: Internal Medicine

## 2019-01-19 IMAGING — DX DG CERVICAL SPINE COMPLETE 4+V
8 series · 8 of 8 positions shown · non-contrast
Comparison: None.

CLINICAL DATA: Hand numbness

EXAM:
CERVICAL SPINE - COMPLETE 4+ VIEW

[dg cervical spine complete (1 of 8)]
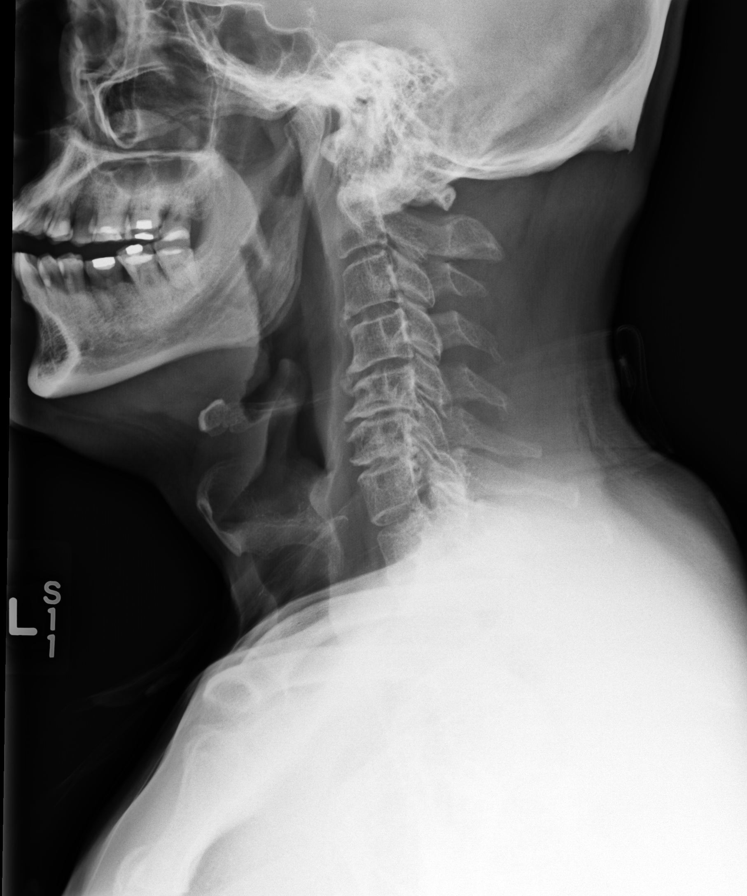

[dg cervical spine complete (2 of 8)]
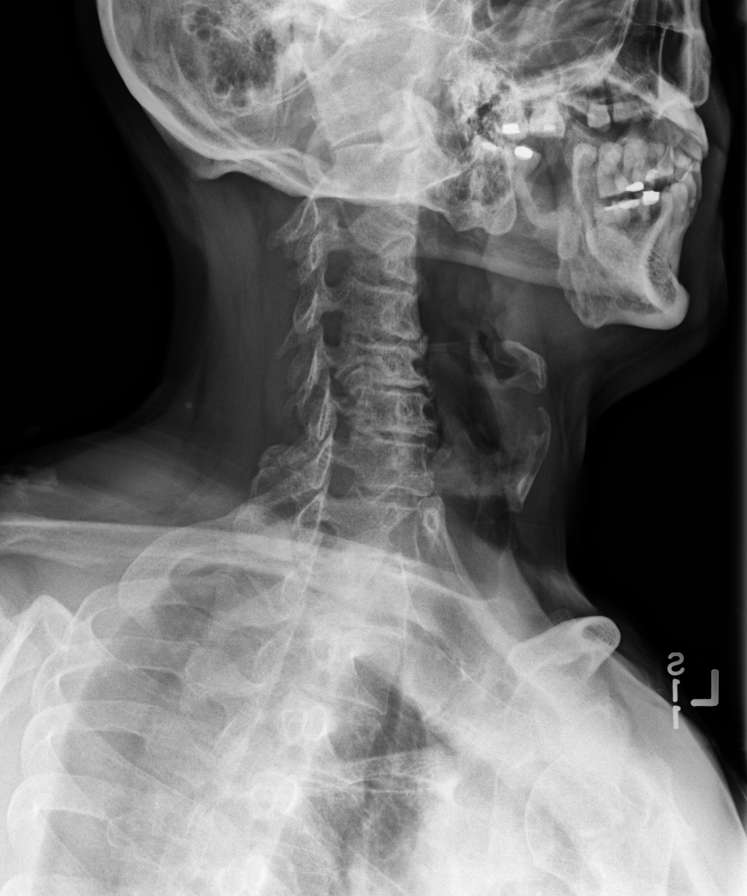

[dg cervical spine complete (3 of 8)]
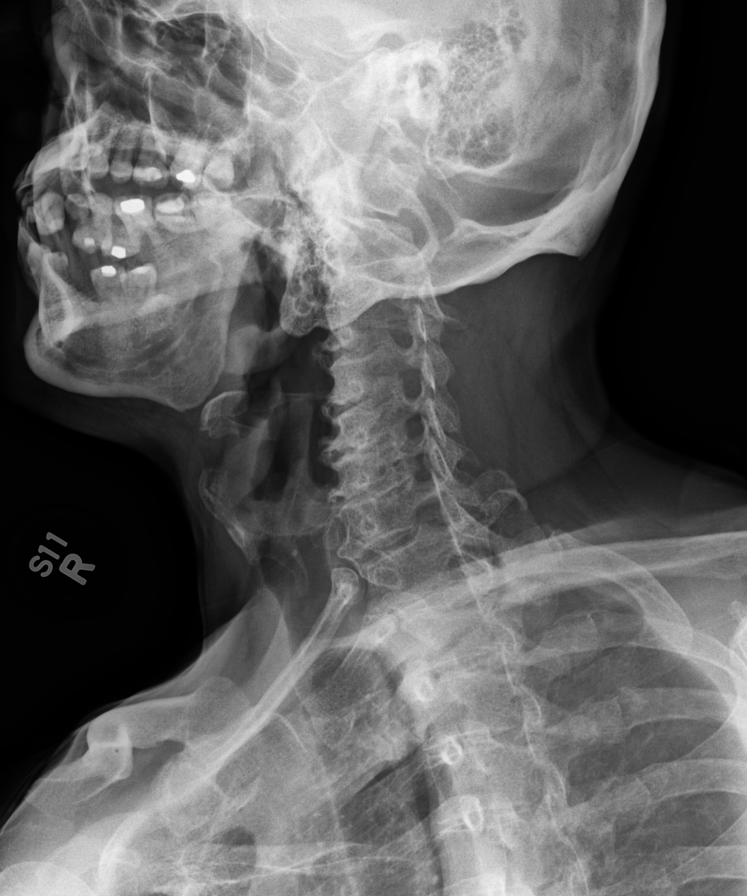

[dg cervical spine complete (4 of 8)]
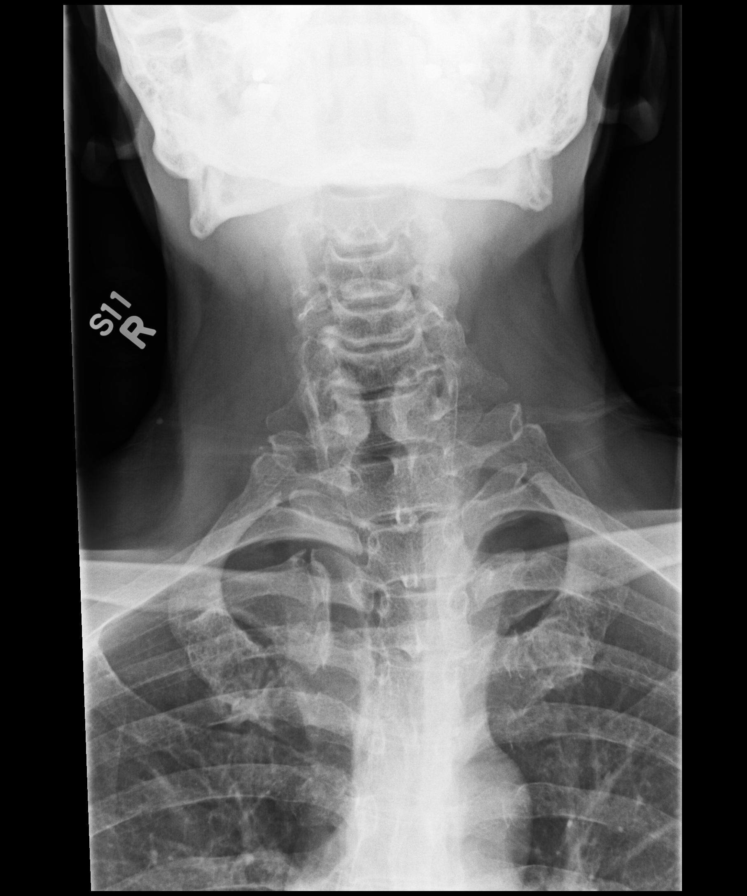

[dg cervical spine complete (5 of 8)]
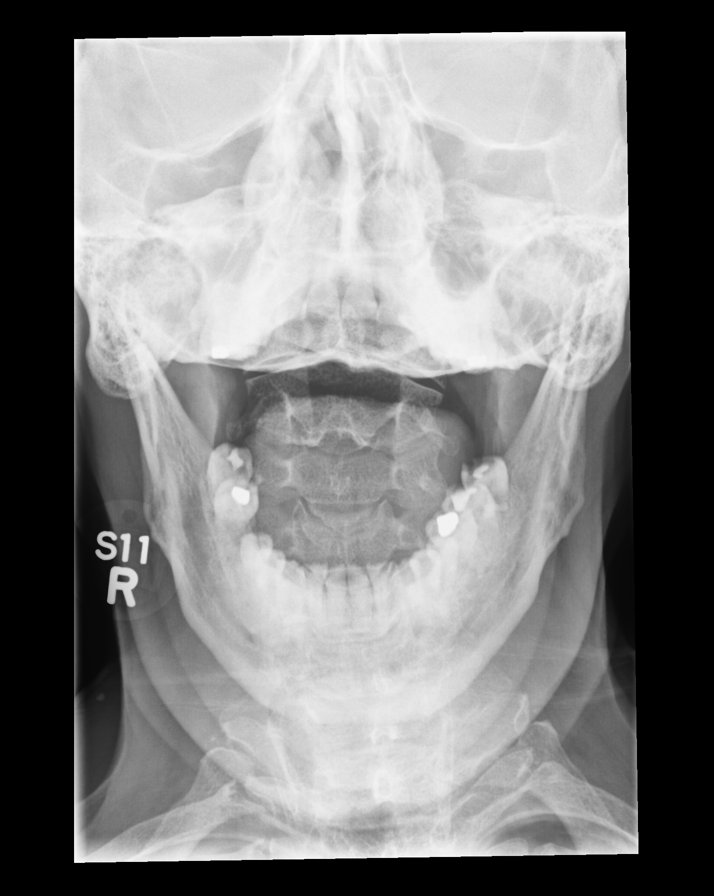

[dg cervical spine complete (6 of 8)]
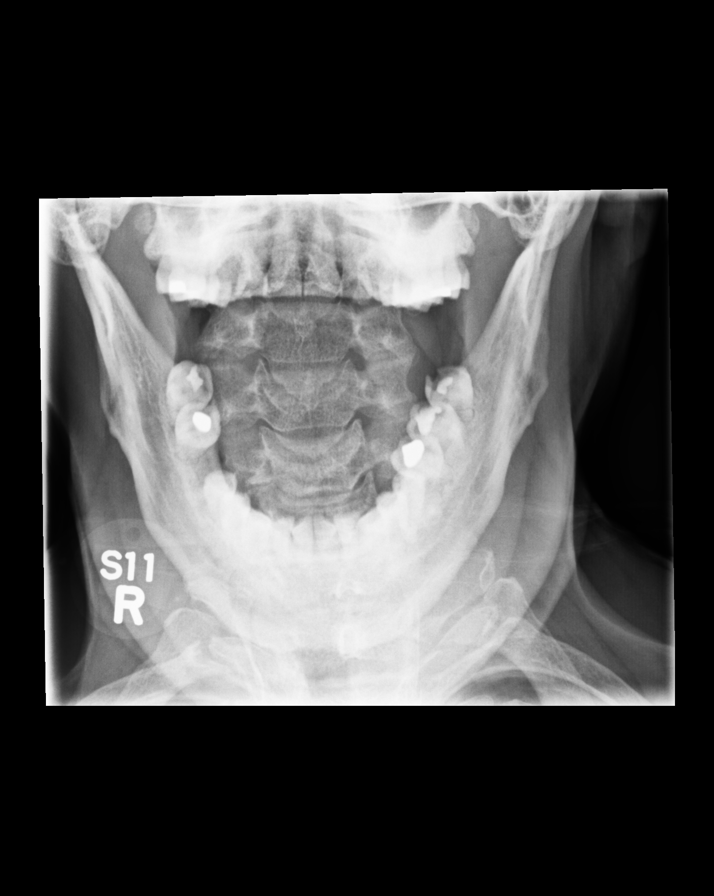

[dg cervical spine complete (7 of 8)]
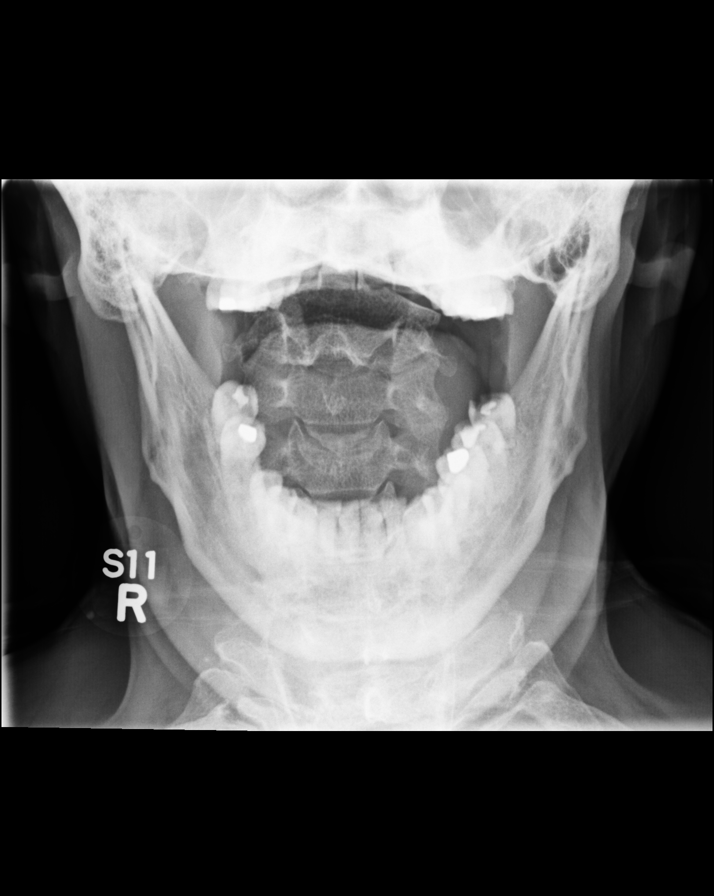

[dg cervical spine complete (8 of 8)]
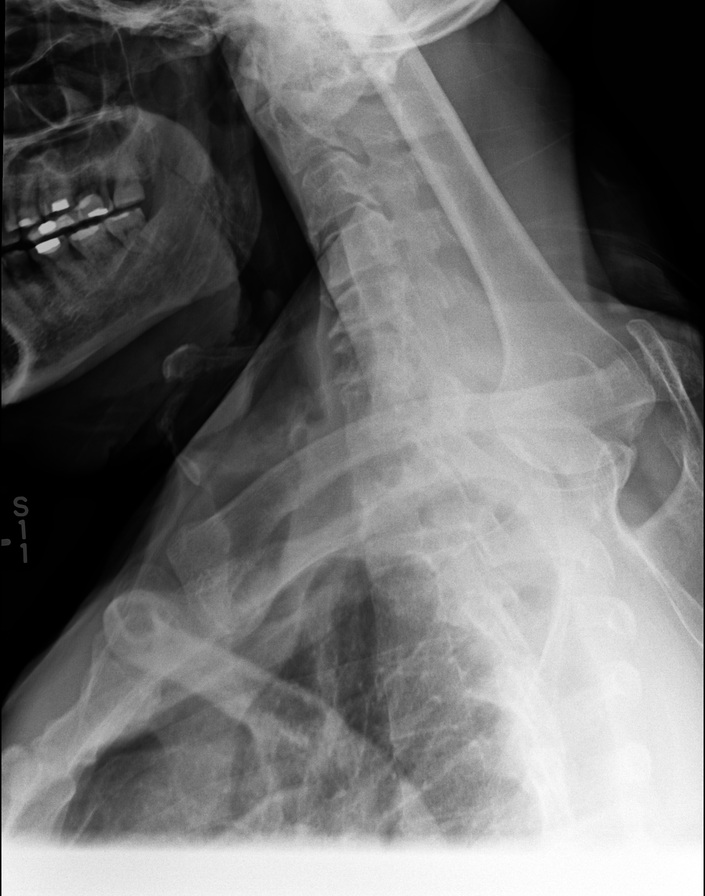

[8 of 8 positions shown; findings below may reference images not displayed]

FINDINGS: Poor visualization of the dens and lateral masses. Lung apices are
clear. Mild reversal of the cervical lordosis. 4 mm retrolisthesis
of C5 on C6. Multilevel degenerative disc changes, mild at C3-C4,
moderate at C4-C5 and C6-C7 and moderate severe at C5-C6. Possible
facet malalignment at C6-C7 with questionable perching. Prevertebral
soft tissue thickness appears normal. Bilateral foraminal narrowing
at C5-C6 and C6-C7.
IMPRESSION: 1. Reversal of cervical lordosis with 4 mm retrolisthesis of C5 on
C6 ; multilevel moderate severe degenerative disc changes
2. Possible facet malalignment at C6-C7 with possible perched
appearance of the facets at this level, CT is recommended for
further evaluation.

## 2019-01-19 IMAGING — DX DG ELBOW COMPLETE 3+V*R*
4 series · 4 of 4 positions shown · non-contrast
Comparison: None.

CLINICAL DATA: Fell several months ago with persistent right elbow
pain

EXAM:
RIGHT ELBOW - COMPLETE 3+ VIEW

[dg elbow complete right (3+view) (1 of 4)]
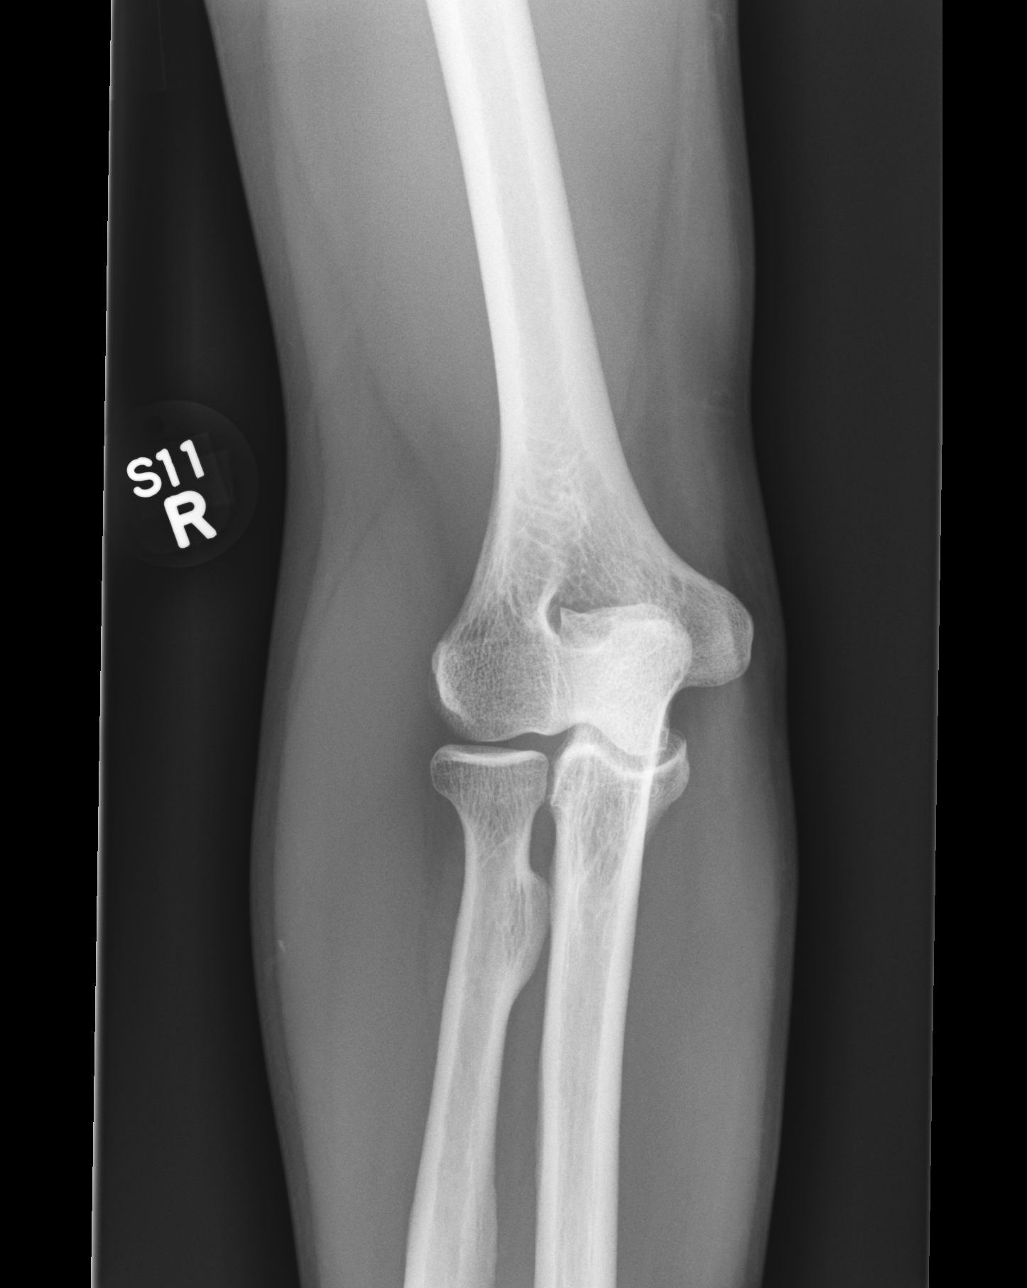

[dg elbow complete right (3+view) (2 of 4)]
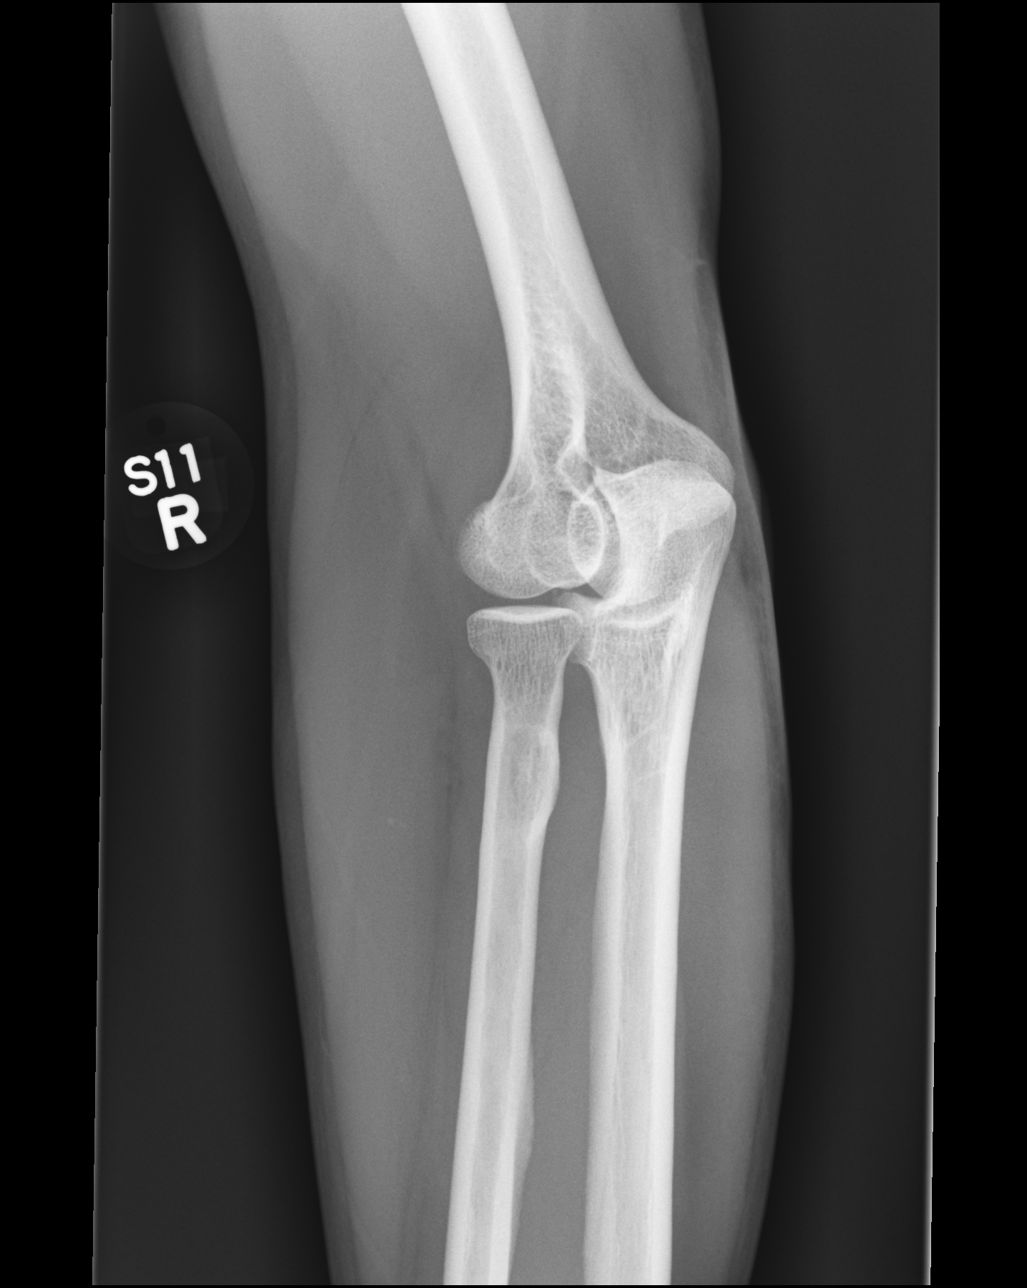

[dg elbow complete right (3+view) (3 of 4)]
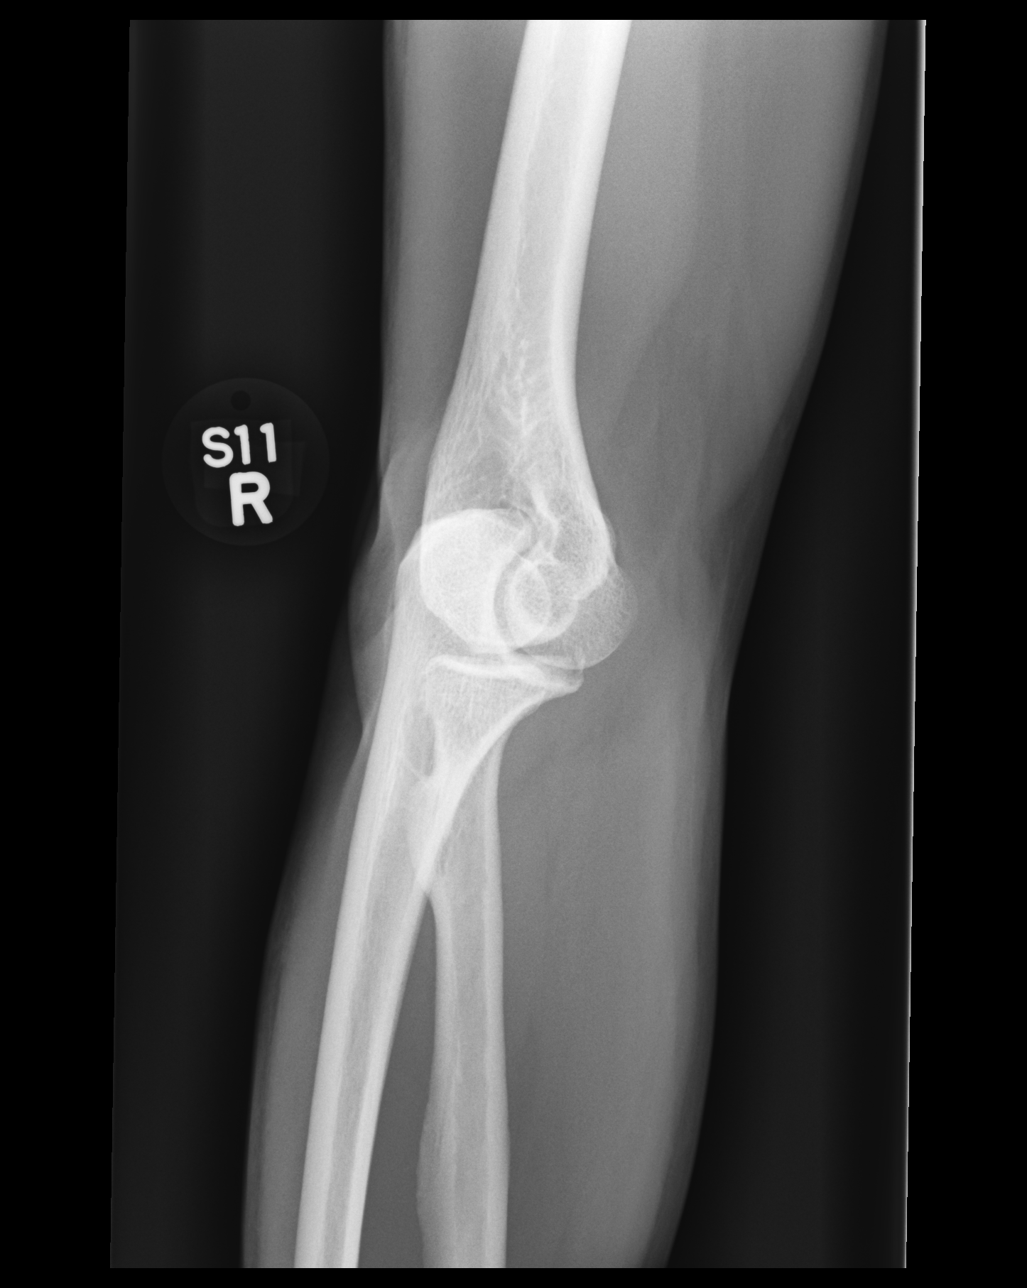

[dg elbow complete right (3+view) (4 of 4)]
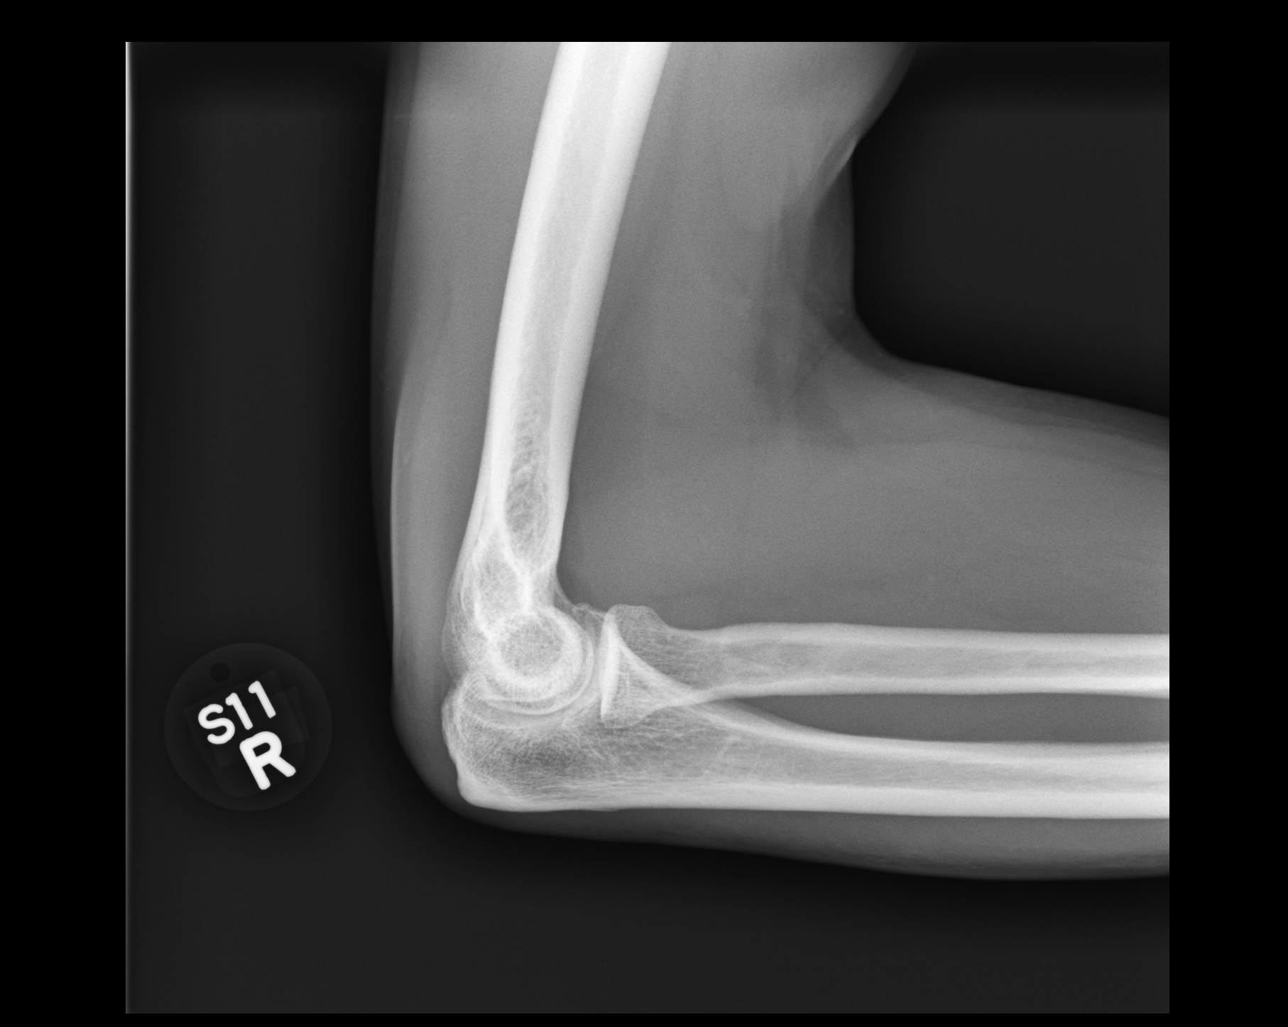

[4 of 4 positions shown; findings below may reference images not displayed]

FINDINGS: Alignment is normal. Joint spaces appear normal. No fracture is
seen. No elbow joint effusion is noted.
IMPRESSION: Negative.

## 2019-01-30 ENCOUNTER — Other Ambulatory Visit: Payer: Self-pay | Admitting: Internal Medicine

## 2019-05-26 ENCOUNTER — Encounter: Payer: BC Managed Care – PPO | Admitting: *Deleted

## 2019-05-26 ENCOUNTER — Other Ambulatory Visit: Payer: Self-pay

## 2019-05-26 ENCOUNTER — Encounter (INDEPENDENT_AMBULATORY_CARE_PROVIDER_SITE_OTHER): Payer: BC Managed Care – PPO | Admitting: *Deleted

## 2019-05-26 VITALS — BP 92/57 | HR 57 | Temp 98.0°F | Wt 186.2 lb

## 2019-05-26 DIAGNOSIS — Z006 Encounter for examination for normal comparison and control in clinical research program: Secondary | ICD-10-CM

## 2019-05-26 LAB — HIV RNA, QUANTITATIVE, PCR
CD4 Count: 1001
CD4%: 41.7
CD8 % Suppressor T Cell: 38
CD8 T Cell Abs: 912
HIV 1 RNA UltraQuant: <40

## 2019-05-26 NOTE — Research (Signed)
Erik Bautista was here for his week 32 visit for The HAILO Study: A Long Term follow-up of Older HIV-Infected Adults in the ACTG, an observational study addressing the issues of aging, HIV infection and Inflammation  He denies any new problems or concerns. He has stayed Covid safe so far this year and is very careful. He did get his flu shot at work in October. I scheduled him to see Dr. Linus Salmons in February since it will be 1 year since he saw him. He is scheduled for the last study visit in August 2021.

## 2019-05-27 LAB — COMPREHENSIVE METABOLIC PANEL
AG Ratio: 1.4 (calc) (ref 1.0–2.5)
ALT: 25 U/L (ref 9–46)
AST: 16 U/L (ref 10–35)
Albumin: 4.4 g/dL (ref 3.6–5.1)
Alkaline phosphatase (APISO): 64 U/L (ref 35–144)
BUN/Creatinine Ratio: 11 (calc) (ref 6–22)
BUN: 17 mg/dL (ref 7–25)
CO2: 28 mmol/L (ref 20–32)
Calcium: 9.6 mg/dL (ref 8.6–10.3)
Chloride: 103 mmol/L (ref 98–110)
Creat: 1.54 mg/dL — ABNORMAL HIGH (ref 0.70–1.33)
Globulin: 3.1 g/dL (calc) (ref 1.9–3.7)
Glucose, Bld: 106 mg/dL — ABNORMAL HIGH (ref 65–99)
Potassium: 4.2 mmol/L (ref 3.5–5.3)
Sodium: 139 mmol/L (ref 135–146)
Total Bilirubin: 0.9 mg/dL (ref 0.2–1.2)
Total Protein: 7.5 g/dL (ref 6.1–8.1)

## 2019-05-27 LAB — LIPID PANEL
Cholesterol: 267 mg/dL — ABNORMAL HIGH (ref <200)
HDL: 60 mg/dL (ref >=40)
LDL Cholesterol (Calc): 171 mg/dL (calc) — ABNORMAL HIGH
Non-HDL Cholesterol (Calc): 207 mg/dL (calc) — ABNORMAL HIGH (ref <130)
Total CHOL/HDL Ratio: 4.5 (calc) (ref <5.0)
Triglycerides: 203 mg/dL — ABNORMAL HIGH (ref <150)

## 2019-05-27 LAB — HEMOGLOBIN A1C
Hgb A1c MFr Bld: 5.5 % of total Hgb (ref <5.7)
Mean Plasma Glucose: 111 (calc)
eAG (mmol/L): 6.2 (calc)

## 2019-05-27 LAB — PROTEIN, URINE, RANDOM: Total Protein, Urine: 15 mg/dL (ref 5–25)

## 2019-05-27 LAB — CREATININE, URINE, RANDOM: Creatinine, Urine: 221 mg/dL (ref 20–320)

## 2019-06-18 ENCOUNTER — Encounter: Payer: Self-pay | Admitting: Infectious Disease

## 2019-07-12 ENCOUNTER — Encounter: Payer: Self-pay | Admitting: Internal Medicine

## 2019-07-12 ENCOUNTER — Other Ambulatory Visit: Payer: Self-pay

## 2019-07-12 ENCOUNTER — Ambulatory Visit (INDEPENDENT_AMBULATORY_CARE_PROVIDER_SITE_OTHER): Payer: 59 | Admitting: Internal Medicine

## 2019-07-12 VITALS — BP 129/70 | HR 58 | Temp 97.8°F | Wt 187.0 lb

## 2019-07-12 DIAGNOSIS — F321 Major depressive disorder, single episode, moderate: Secondary | ICD-10-CM | POA: Diagnosis not present

## 2019-07-12 DIAGNOSIS — B20 Human immunodeficiency virus [HIV] disease: Secondary | ICD-10-CM

## 2019-07-12 DIAGNOSIS — G4700 Insomnia, unspecified: Secondary | ICD-10-CM | POA: Diagnosis not present

## 2019-07-12 MED ORDER — TRAZODONE HCL 50 MG PO TABS: 50.0000 mg | ORAL_TABLET | Freq: Every evening | ORAL | 5 refills | Status: DC | PRN

## 2019-07-12 NOTE — Assessment & Plan Note (Signed)
He is doing well and can rtc in 1 year.  Labs with research through August 2021 then back to me.

## 2019-07-12 NOTE — Assessment & Plan Note (Signed)
We discussed counseling but not interested at this time.  Hopefully with better sleep and our discussion of COVID risk, he will have less anxiety.

## 2019-07-12 NOTE — Progress Notes (Signed)
   Subjective:    Patient ID: Erik Bautista, male    DOB: 07/26/1965, 54 y.o.   MRN: [id number]  HPI Here for follow up of HIV He continues on Biktarvy and no missed doses.  He is having more issues with sleep.  Some anxiety related to COVID and concerns for being more susceptible of severe disease being HIV positive.  Otherwise no new issues.  CD4 and viral load good and checked as part of his research protocol.    Review of Systems  Constitutional: Negative for unexpected weight change.  Gastrointestinal: Negative for diarrhea.  Skin: Negative for rash.  Psychiatric/Behavioral: Positive for sleep disturbance. Negative for suicidal ideas. The patient is nervous/anxious.        Objective:   Physical Exam Constitutional:      Appearance: Normal appearance.  Eyes:     General: No scleral icterus. Pulmonary:     Effort: Pulmonary effort is normal.  Neurological:     General: No focal deficit present.     Mental Status: He is alert.  Psychiatric:        Mood and Affect: Mood normal.   SH: former smoker        Assessment & Plan:

## 2019-07-12 NOTE — Assessment & Plan Note (Signed)
New problem and will try trazodone. He will call if there are any further issues.

## 2019-07-26 ENCOUNTER — Other Ambulatory Visit: Payer: Self-pay

## 2019-07-26 DIAGNOSIS — B20 Human immunodeficiency virus [HIV] disease: Secondary | ICD-10-CM

## 2019-07-26 MED ORDER — BIKTARVY 50-200-25 MG PO TABS: 1.0000 | ORAL_TABLET | Freq: Every day | ORAL | 5 refills | Status: DC

## 2019-08-19 ENCOUNTER — Ambulatory Visit: Payer: 59 | Attending: Internal Medicine

## 2019-08-19 DIAGNOSIS — Z23 Encounter for immunization: Secondary | ICD-10-CM

## 2019-08-19 NOTE — Progress Notes (Signed)
   Covid-19 Vaccination Clinic  Name:  Erik Bautista    MRN: [id number] DOB: 1965-09-20  08/19/2019  [name] was observed post Covid-19 immunization for 15 minutes without incident. He was provided with Vaccine Information Sheet and instruction to access the V-Safe system.   [name] was instructed to call 911 with any severe reactions post vaccine: Marland Kitchen Difficulty breathing  . Swelling of face and throat  . A fast heartbeat  . A bad rash all over body  . Dizziness and weakness   Immunizations Administered    Name Date Dose VIS Date Route   Pfizer COVID-19 Vaccine 08/19/2019 12:04 PM 0.3 mL 05/14/2019 Intramuscular   Manufacturer: [place]   Lot: [id number]   [place]: [id number]

## 2019-09-06 ENCOUNTER — Ambulatory Visit: Payer: 59

## 2019-09-13 ENCOUNTER — Ambulatory Visit: Payer: 59 | Attending: Internal Medicine

## 2019-09-13 DIAGNOSIS — Z23 Encounter for immunization: Secondary | ICD-10-CM

## 2019-09-13 NOTE — Progress Notes (Signed)
   Covid-19 Vaccination Clinic  Name:  Erik Bautista    MRN: [id number] DOB: [birth year]  09/13/2019  Mr. Pata was observed post Covid-19 immunization for 15 minutes without incident. He was provided with Vaccine Information Sheet and instruction to access the V-Safe system.   Mr. Buckmaster was instructed to call 911 with any severe reactions post vaccine: Marland Kitchen Difficulty breathing  . Swelling of face and throat  . A fast heartbeat  . A bad rash all over body  . Dizziness and weakness   Immunizations Administered    Name Date Dose VIS Date Route   Pfizer COVID-19 Vaccine 09/13/2019  4:36 PM 0.3 mL 05/14/2019 Intramuscular   Manufacturer: [place]   Lot: [id number]   [place]: [id number]

## 2019-12-29 ENCOUNTER — Other Ambulatory Visit: Payer: Self-pay | Admitting: Internal Medicine

## 2020-01-26 ENCOUNTER — Encounter: Payer: Self-pay | Admitting: Internal Medicine

## 2020-01-26 ENCOUNTER — Other Ambulatory Visit: Payer: Self-pay

## 2020-01-26 ENCOUNTER — Encounter (INDEPENDENT_AMBULATORY_CARE_PROVIDER_SITE_OTHER): Payer: Self-pay | Admitting: *Deleted

## 2020-01-26 VITALS — BP 115/74 | HR 61 | Temp 98.3°F | Wt 179.5 lb

## 2020-01-26 DIAGNOSIS — Z006 Encounter for examination for normal comparison and control in clinical research program: Secondary | ICD-10-CM

## 2020-01-26 LAB — COMPREHENSIVE METABOLIC PANEL
AG Ratio: 1.5 (calc) (ref 1.0–2.5)
ALT: 13 U/L (ref 9–46)
AST: 14 U/L (ref 10–35)
Albumin: 4.3 g/dL (ref 3.6–5.1)
Alkaline phosphatase (APISO): 64 U/L (ref 35–144)
BUN/Creatinine Ratio: 13 (calc) (ref 6–22)
BUN: 20 mg/dL (ref 7–25)
CO2: 21 mmol/L (ref 20–32)
Calcium: 9.3 mg/dL (ref 8.6–10.3)
Chloride: 105 mmol/L (ref 98–110)
Creat: 1.58 mg/dL — ABNORMAL HIGH (ref 0.70–1.33)
Globulin: 2.8 g/dL (calc) (ref 1.9–3.7)
Glucose, Bld: 122 mg/dL — ABNORMAL HIGH (ref 65–99)
Potassium: 3.8 mmol/L (ref 3.5–5.3)
Sodium: 137 mmol/L (ref 135–146)
Total Bilirubin: 0.7 mg/dL (ref 0.2–1.2)
Total Protein: 7.1 g/dL (ref 6.1–8.1)

## 2020-01-26 NOTE — Research (Signed)
  Erik Bautista was here for his final visit for The HAILO Study: A Long Term follow-up of Older HIV-Infected Adults in the ACTG, an observational study addressing the issues of aging, HIV infection and Inflammation  He says he is doing well, no complaints, working full-time. He has lost a few pounds which he said was intentional. He started trazodone in February and that is helping him sleep. He will be following up with Dr. Linus Salmons in the clinic.

## 2020-01-27 LAB — PROTEIN / CREATININE RATIO, URINE
Creatinine, Urine: 203 mg/dL (ref 20–320)
Protein/Creat Ratio: 69 mg/g creat (ref 22–128)
Protein/Creatinine Ratio: 0.069 mg/mg creat (ref 0.022–0.12)
Total Protein, Urine: 14 mg/dL (ref 5–25)

## 2020-01-27 LAB — CD4/CD8 (T-HELPER/T-SUPPRESSOR CELL)
CD4 % Helper T Cell: 40.7
CD4 Count: 1099
CD8 % Suppressor T Cell: 36.2
CD8 T Cell Abs: 977
HIV 1 RNA UltraQuant: <40

## 2020-02-15 ENCOUNTER — Encounter: Payer: Self-pay | Admitting: Family Medicine

## 2020-02-23 ENCOUNTER — Encounter: Payer: Self-pay | Admitting: Infectious Disease

## 2020-02-24 NOTE — Progress Notes (Signed)
Subjective:    Patient ID: Erik Bautista, male    DOB: 1966-05-15, 54 y.o.   MRN: [id number]  HPI Chief Complaint  Patient presents with  . new pt    new pt get established, sugar was high, wants prostate screening    He is new to the practice and here to establish care.  Previous medical care: Followed by infectious disease but no PCP  Other providers: ID- Dr. Linus Salmons   HIV- under good control. He was in a research study that ended.  He sees Dr., Regularly.  Requests PSA blood test.  No personal or family history of prostate cancer.  Denies any urinary symptoms.  States his last PSA was approximately 7 years ago.  States his glucose was elevated on 01/26/2020. No history of prediabetes or DM   Please also had elevated serum creatinine.  States he has been taking ibuprofen recently.  He had a fall approximately 1 month ago landing on his left shoulder.  States pain has been improving overall.  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  No numbness, tingling or weakness.  Denies any issues with range of motion.  05/26/2019 his LDL was 171, HDL 60, triglycerides 203.  States he was dealing with depression months ago. He saw a therapist but not lately. He feels that he is managing better.   Taking trazodone for sleep. Shift work affects his sleep.   Social history: Lives alone, no children, works in a Human resources officer, night shifts  Smoked cigarettes for 7-8 years when younger. No alcohol.  History of marijuana use.   Immunizations: Covid vaccine[employer] Schwab in February.   Health maintenance:  Colonoscopy: 7 years ago  Last PSA: 7 years ago    Reviewed allergies, medications, past medical, surgical, family, and social history.    Review of Systems Pertinent positives and negatives in the history of present illness.     Objective:   Physical Exam BP 110/70   Pulse 63   Ht 5\' 10"  (1.778 m)   Wt 176 lb (79.8 kg)   BMI 25.25 kg/m   Alert and in  no distress. Cardiac exam shows a regular sinus rhythm without murmurs or gallops. Lungs are clear to auscultation.  Left shoulder exam- mild pain with abduction and external rotation.  No laxity.  No erythema, edema and nontender.  Left upper extremity is neurovascularly intact.  Negative drop arm test.  Negative Neer's and Hawkins.        Assessment & Plan:  Elevated serum glucose - Plan: HgB A1c, CBC with Differential/Platelet, POCT Urinalysis DIP (Proadvantage Device) -He is a pleasant 54 year old male who is new to the practice. Recent elevated serum glucose.  Discussed that his hemoglobin A1c today is 5.3% and normal.  No sign of diabetes or prediabetes.  He has recently been eating a healthy diet and I recommend he continue doing so.  Human immunodeficiency virus (HIV) disease ([place]) - Plan: CBC with Differential/Platelet -Well-controlled and followed closely by infectious disease.  Elevated serum creatinine - Plan: CBC with Differential/Platelet, Comprehensive metabolic panel -Discussed that his kidney function has been elevated since December 2020.  He reports recently taking ibuprofen before work.  Recommend he cut back on NSAIDs.  Try Tylenol if needed for aches and pains.  Elevated LDL cholesterol level - Plan: Lipid panel -He is fasting I will check lipid panel today.  Acute pain of left shoulder -Unremarkable shoulder exam.  Discussed using topical pain medication  and Tylenol and avoiding NSAIDs.  Follow-up if he is not continuing to improve.  Screening for prostate cancer - Plan: PSA -States his last PSA was approximately 7 years ago.  I will order this today per his request.

## 2020-02-25 ENCOUNTER — Encounter: Payer: Self-pay | Admitting: Family Medicine

## 2020-02-25 ENCOUNTER — Other Ambulatory Visit: Payer: Self-pay

## 2020-02-25 ENCOUNTER — Ambulatory Visit (INDEPENDENT_AMBULATORY_CARE_PROVIDER_SITE_OTHER): Payer: 59 | Admitting: Family Medicine

## 2020-02-25 VITALS — BP 110/70 | HR 63 | Ht 70.0 in | Wt 176.0 lb

## 2020-02-25 DIAGNOSIS — E7800 Pure hypercholesterolemia, unspecified: Secondary | ICD-10-CM

## 2020-02-25 DIAGNOSIS — R739 Hyperglycemia, unspecified: Secondary | ICD-10-CM | POA: Diagnosis not present

## 2020-02-25 DIAGNOSIS — M25512 Pain in left shoulder: Secondary | ICD-10-CM

## 2020-02-25 DIAGNOSIS — B20 Human immunodeficiency virus [HIV] disease: Secondary | ICD-10-CM

## 2020-02-25 DIAGNOSIS — Z125 Encounter for screening for malignant neoplasm of prostate: Secondary | ICD-10-CM

## 2020-02-25 DIAGNOSIS — R7989 Other specified abnormal findings of blood chemistry: Secondary | ICD-10-CM

## 2020-02-25 LAB — POCT GLYCOSYLATED HEMOGLOBIN (HGB A1C): Hemoglobin A1C: 5.3 % (ref 4.0–5.6)

## 2020-02-26 LAB — COMPREHENSIVE METABOLIC PANEL
ALT: 22 IU/L (ref 0–44)
AST: 19 IU/L (ref 0–40)
Albumin/Globulin Ratio: 1.6 (ref 1.2–2.2)
Albumin: 4.8 g/dL (ref 3.8–4.9)
Alkaline Phosphatase: 89 IU/L (ref 44–121)
BUN/Creatinine Ratio: 15 (ref 9–20)
BUN: 20 mg/dL (ref 6–24)
Bilirubin Total: 0.5 mg/dL (ref 0.0–1.2)
CO2: 23 mmol/L (ref 20–29)
Calcium: 9.8 mg/dL (ref 8.7–10.2)
Chloride: 103 mmol/L (ref 96–106)
Creatinine, Ser: 1.36 mg/dL — ABNORMAL HIGH (ref 0.76–1.27)
GFR calc Af Amer: 67 mL/min/{1.73_m2} (ref >59)
GFR calc non Af Amer: 58 mL/min/{1.73_m2} — ABNORMAL LOW (ref >59)
Globulin, Total: 3 g/dL (ref 1.5–4.5)
Glucose: 93 mg/dL (ref 65–99)
Potassium: 4.7 mmol/L (ref 3.5–5.2)
Sodium: 139 mmol/L (ref 134–144)
Total Protein: 7.8 g/dL (ref 6.0–8.5)

## 2020-02-26 LAB — CBC WITH DIFFERENTIAL/PLATELET
Basophils Absolute: 0 10*3/uL (ref 0.0–0.2)
Basos: 0 %
EOS (ABSOLUTE): 0.2 10*3/uL (ref 0.0–0.4)
Eos: 3 %
Hematocrit: 43.7 % (ref 37.5–51.0)
Hemoglobin: 15 g/dL (ref 13.0–17.7)
Immature Grans (Abs): 0 10*3/uL (ref 0.0–0.1)
Immature Granulocytes: 0 %
Lymphocytes Absolute: 2.3 10*3/uL (ref 0.7–3.1)
Lymphs: 36 %
MCH: 29.8 pg (ref 26.6–33.0)
MCHC: 34.3 g/dL (ref 31.5–35.7)
MCV: 87 fL (ref 79–97)
Monocytes Absolute: 0.5 10*3/uL (ref 0.1–0.9)
Monocytes: 8 %
Neutrophils Absolute: 3.4 10*3/uL (ref 1.4–7.0)
Neutrophils: 53 %
Platelets: 315 10*3/uL (ref 150–450)
RBC: 5.03 x10E6/uL (ref 4.14–5.80)
RDW: 13.6 % (ref 11.6–15.4)
WBC: 6.5 10*3/uL (ref 3.4–10.8)

## 2020-02-26 LAB — LIPID PANEL
Chol/HDL Ratio: 4.7 ratio (ref 0.0–5.0)
Cholesterol, Total: 240 mg/dL — ABNORMAL HIGH (ref 100–199)
HDL: 51 mg/dL (ref >39)
LDL Chol Calc (NIH): 148 mg/dL — ABNORMAL HIGH (ref 0–99)
Triglycerides: 224 mg/dL — ABNORMAL HIGH (ref 0–149)
VLDL Cholesterol Cal: 41 mg/dL — ABNORMAL HIGH (ref 5–40)

## 2020-02-26 LAB — PSA: Prostate Specific Ag, Serum: 1.2 ng/mL (ref 0.0–4.0)

## 2020-05-20 ENCOUNTER — Other Ambulatory Visit: Payer: Self-pay | Admitting: Internal Medicine

## 2020-05-20 DIAGNOSIS — B20 Human immunodeficiency virus [HIV] disease: Secondary | ICD-10-CM

## 2020-07-10 ENCOUNTER — Other Ambulatory Visit: Payer: Self-pay | Admitting: Internal Medicine

## 2020-07-10 DIAGNOSIS — B20 Human immunodeficiency virus [HIV] disease: Secondary | ICD-10-CM

## 2020-08-08 ENCOUNTER — Other Ambulatory Visit: Payer: Self-pay | Admitting: Internal Medicine

## 2020-08-08 DIAGNOSIS — B20 Human immunodeficiency virus [HIV] disease: Secondary | ICD-10-CM

## 2020-08-12 ENCOUNTER — Other Ambulatory Visit: Payer: Self-pay | Admitting: Internal Medicine

## 2020-09-01 ENCOUNTER — Other Ambulatory Visit: Payer: Self-pay

## 2020-09-01 DIAGNOSIS — B20 Human immunodeficiency virus [HIV] disease: Secondary | ICD-10-CM

## 2020-09-01 DIAGNOSIS — Z113 Encounter for screening for infections with a predominantly sexual mode of transmission: Secondary | ICD-10-CM

## 2020-09-05 ENCOUNTER — Other Ambulatory Visit: Payer: Self-pay

## 2020-09-05 ENCOUNTER — Other Ambulatory Visit: Payer: 59

## 2020-09-05 DIAGNOSIS — Z113 Encounter for screening for infections with a predominantly sexual mode of transmission: Secondary | ICD-10-CM

## 2020-09-05 DIAGNOSIS — B20 Human immunodeficiency virus [HIV] disease: Secondary | ICD-10-CM

## 2020-09-06 LAB — T-HELPER CELL (CD4) - (RCID CLINIC ONLY)
CD4 % Helper T Cell: 40 % (ref 33–65)
CD4 T Cell Abs: 944 /uL (ref 400–1790)

## 2020-09-07 LAB — HIV-1 RNA QUANT-NO REFLEX-BLD
HIV 1 RNA Quant: <20 Copies/mL — ABNORMAL HIGH
HIV-1 RNA Quant, Log: <1.3 Log cps/mL — ABNORMAL HIGH

## 2020-09-07 LAB — CBC WITH DIFFERENTIAL/PLATELET
Absolute Monocytes: 490 cells/uL (ref 200–950)
Basophils Absolute: 28 cells/uL (ref 0–200)
Basophils Relative: 0.4 %
Eosinophils Absolute: 152 cells/uL (ref 15–500)
Eosinophils Relative: 2.2 %
HCT: 41.8 % (ref 38.5–50.0)
Hemoglobin: 14.1 g/dL (ref 13.2–17.1)
Lymphs Abs: 2374 cells/uL (ref 850–3900)
MCH: 29.9 pg (ref 27.0–33.0)
MCHC: 33.7 g/dL (ref 32.0–36.0)
MCV: 88.7 fL (ref 80.0–100.0)
MPV: 9.8 fL (ref 7.5–12.5)
Monocytes Relative: 7.1 %
Neutro Abs: 3857 cells/uL (ref 1500–7800)
Neutrophils Relative %: 55.9 %
Platelets: 288 10*3/uL (ref 140–400)
RBC: 4.71 10*6/uL (ref 4.20–5.80)
RDW: 13.8 % (ref 11.0–15.0)
Total Lymphocyte: 34.4 %
WBC: 6.9 10*3/uL (ref 3.8–10.8)

## 2020-09-07 LAB — COMPLETE METABOLIC PANEL WITH GFR
AG Ratio: 1.6 (calc) (ref 1.0–2.5)
ALT: 17 U/L (ref 9–46)
AST: 15 U/L (ref 10–35)
Albumin: 4.6 g/dL (ref 3.6–5.1)
Alkaline phosphatase (APISO): 70 U/L (ref 35–144)
BUN/Creatinine Ratio: 12 (calc) (ref 6–22)
BUN: 18 mg/dL (ref 7–25)
CO2: 25 mmol/L (ref 20–32)
Calcium: 9.8 mg/dL (ref 8.6–10.3)
Chloride: 105 mmol/L (ref 98–110)
Creat: 1.49 mg/dL — ABNORMAL HIGH (ref 0.70–1.33)
GFR, Est African American: 60 mL/min/{1.73_m2} (ref >=60)
GFR, Est Non African American: 52 mL/min/{1.73_m2} — ABNORMAL LOW (ref >=60)
Globulin: 2.8 g/dL (calc) (ref 1.9–3.7)
Glucose, Bld: 111 mg/dL — ABNORMAL HIGH (ref 65–99)
Potassium: 3.9 mmol/L (ref 3.5–5.3)
Sodium: 141 mmol/L (ref 135–146)
Total Bilirubin: 0.6 mg/dL (ref 0.2–1.2)
Total Protein: 7.4 g/dL (ref 6.1–8.1)

## 2020-09-07 LAB — RPR TITER: RPR Titer: 1:1 {titer} — ABNORMAL HIGH

## 2020-09-07 LAB — RPR: RPR Ser Ql: REACTIVE — AB

## 2020-09-07 LAB — FLUORESCENT TREPONEMAL AB(FTA)-IGG-BLD: Fluorescent Treponemal ABS: REACTIVE — AB

## 2020-09-26 ENCOUNTER — Ambulatory Visit (INDEPENDENT_AMBULATORY_CARE_PROVIDER_SITE_OTHER): Payer: 59 | Admitting: Internal Medicine

## 2020-09-26 ENCOUNTER — Other Ambulatory Visit: Payer: Self-pay

## 2020-09-26 ENCOUNTER — Encounter: Payer: Self-pay | Admitting: Internal Medicine

## 2020-09-26 VITALS — BP 120/77 | HR 69 | Temp 97.3°F | Resp 16 | Ht 70.0 in | Wt 184.0 lb

## 2020-09-26 DIAGNOSIS — Z113 Encounter for screening for infections with a predominantly sexual mode of transmission: Secondary | ICD-10-CM

## 2020-09-26 DIAGNOSIS — R202 Paresthesia of skin: Secondary | ICD-10-CM

## 2020-09-26 DIAGNOSIS — G4700 Insomnia, unspecified: Secondary | ICD-10-CM | POA: Diagnosis not present

## 2020-09-26 DIAGNOSIS — B20 Human immunodeficiency virus [HIV] disease: Secondary | ICD-10-CM | POA: Diagnosis not present

## 2020-09-26 DIAGNOSIS — Z5181 Encounter for therapeutic drug level monitoring: Secondary | ICD-10-CM

## 2020-09-26 DIAGNOSIS — R200 Anesthesia of skin: Secondary | ICD-10-CM | POA: Diagnosis not present

## 2020-09-26 DIAGNOSIS — G5601 Carpal tunnel syndrome, right upper limb: Secondary | ICD-10-CM | POA: Diagnosis not present

## 2020-09-26 MED ORDER — BIKTARVY 50-200-25 MG PO TABS
1.0000 | ORAL_TABLET | Freq: Every day | ORAL | 11 refills | Status: DC
Start: 2020-09-26 — End: 2021-10-19

## 2020-09-26 MED ORDER — TRAZODONE HCL 50 MG PO TABS: ORAL_TABLET | ORAL | 11 refills | Status: DC

## 2020-09-26 NOTE — Assessment & Plan Note (Signed)
Screened negative 

## 2020-09-26 NOTE — Assessment & Plan Note (Signed)
His creat is stable and no significant changes.  Will continue to monitor.

## 2020-09-26 NOTE — Progress Notes (Signed)
   Subjective:    Patient ID: Erik Bautista, male    DOB: 07-25-1965, 55 y.o.   MRN: [id number]  HPI Here for follow up of HIV He continues on Biktarvy with no missed doses.  CD4 of 944 and viral load < 20.  No issues with getting, taking or tolerating the medication. Creat is stable at 1.49, similar to previous.  No longer followed by the study team.  He has noted some numbness and tingling of his right hand and some pain of his right shoulder.  He previously had neck surgery to relieve a pinched nerve that caused left hand numbness and tingling and wondering if the same thing is happening on the other side.  He uses his right hand a lot for work and shoulder for lifting.    Review of Systems  Constitutional: Negative for unexpected weight change.  Gastrointestinal: Negative for diarrhea and nausea.  Skin: Negative for rash.       Objective:   Physical Exam Eyes:     General: No scleral icterus. Pulmonary:     Effort: Pulmonary effort is normal.  Skin:    Findings: No rash.  Neurological:     Mental Status: He is alert.  Psychiatric:        Mood and Affect: Mood normal.   SH: no tobacco       Assessment & Plan:

## 2020-09-26 NOTE — Assessment & Plan Note (Signed)
This is a new issue and his symptoms of right hand numbness and tingling, particularly of the radial side most c/w carpet tunnel.  I recommended he try a nighttime wrist splint and see if it improves.  If not, he will return to the sports medicine clinic.

## 2020-10-12 ENCOUNTER — Ambulatory Visit (INDEPENDENT_AMBULATORY_CARE_PROVIDER_SITE_OTHER): Payer: 59 | Admitting: Family Medicine

## 2020-10-12 ENCOUNTER — Encounter: Payer: Self-pay | Admitting: Family Medicine

## 2020-10-12 ENCOUNTER — Other Ambulatory Visit: Payer: Self-pay

## 2020-10-12 VITALS — BP 110/70 | HR 60 | Ht 70.0 in | Wt 180.0 lb

## 2020-10-12 DIAGNOSIS — Z1211 Encounter for screening for malignant neoplasm of colon: Secondary | ICD-10-CM | POA: Diagnosis not present

## 2020-10-12 DIAGNOSIS — Z23 Encounter for immunization: Secondary | ICD-10-CM

## 2020-10-12 DIAGNOSIS — K648 Other hemorrhoids: Secondary | ICD-10-CM | POA: Diagnosis not present

## 2020-10-12 DIAGNOSIS — K625 Hemorrhage of anus and rectum: Secondary | ICD-10-CM | POA: Diagnosis not present

## 2020-10-12 MED ORDER — HYDROCORTISONE ACETATE 25 MG RE SUPP: 25.0000 mg | Freq: Two times a day (BID) | RECTAL | 0 refills | Status: DC

## 2020-10-12 NOTE — Progress Notes (Signed)
Chief Complaint  Patient presents with  . Rectal Bleeding    Rectal bleeding x 5 days. Noticed when he has bowel movements, can see in water in toilet. Can also feel when he wakes up in the morning that there is something there-when he wipes with toilet paper there is blood, not a ton. No pain or discomfort, no itching.    5 days ago he noted bright red blood on the toilet paper, as well as some on the outside of the stool.  Stool was soft, not loose, no straining, not painful.  No diarrhea, constipation or straining. 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  He has h/o internal hemorrhoids about 7 years ago.  He recalls that was painful.  He denies any abdominal pain, nausea, vomiting.   PMH, PSH, SH reviewed Denies family history of colon cancer or polyps  Outpatient Encounter Medications as of 10/12/2020  Medication Sig  . bictegravir-emtricitabine-tenofovir AF (BIKTARVY) 50-200-25 MG TABS tablet Take 1 tablet by mouth daily.  . Multiple Vitamins-Minerals (MENS 50+ MULTI VITAMIN/MIN PO) Take 1 tablet by mouth daily.  Marland Kitchen acetaminophen (TYLENOL) 500 MG tablet Take 1 tablet (500 mg total) by mouth every 6 (six) hours as needed. (Patient not taking: Reported on 10/12/2020)  . calcium carbonate (TUMS - DOSED IN MG ELEMENTAL CALCIUM) 500 MG chewable tablet Chew 2 tablets by mouth as needed for indigestion or heartburn. (Patient not taking: Reported on 10/12/2020)  . traZODone (DESYREL) 50 MG tablet TAKE 1 TABLET(50 MG) BY MOUTH AT BEDTIME AS NEEDED FOR SLEEP (Patient not taking: Reported on 10/12/2020)   No facility-administered encounter medications on file as of 10/12/2020.   No Known Allergies  ROS: no fever, chills, headaches, dizziness, chest pain, URI symptoms, nausea, vomiting, abdominal pain.  + BRBPR per HPI.  No urinary complaints.     PHYSICAL EXAM:  BP 110/70   Pulse 60   Ht 5\' 10"  (1.778 m)   Wt 180 lb (81.6 kg)   BMI 25.83 kg/m   Wt Readings from Last 3  Encounters:  10/12/20 180 lb (81.6 kg)  09/26/20 184 lb (83.5 kg)  02/25/20 176 lb (79.8 kg)   wel-appearing, pleasant male, in no distress HEENT: conjunctiva and sclera are clear, EOMI, wearing mask Heart: regular rate and rhythm Lungs: clear bilaterally Back: no CVA tenderness Abdomen: soft, nontender, no mass Rectal: no external hemorrhoids, fissures or bleeding. Anoscopy: +internal hemorrhoids noted, inflamed.  No active bleeding.  Stool is soft, light brown, heme negative. Psych: normal mood, affect, hygiene and grooming   ASSESSMENT/PLAN:  Internal hemorrhoids - Plan: hydrocortisone (ANUSOL-HC) 25 MG suppository, PR DIAGNOSTIC ANOSCOPY  Need for COVID-19 vaccine - Plan: PFIZER Comirnaty(GRAY TOP)COVID-19 Vaccine  Colon cancer screening - Plan: Ambulatory referral to Gastroenterology  Rectal bleeding - due to internal hemorrhoids - Plan: PR DIAGNOSTIC ANOSCOPY

## 2020-10-12 NOTE — Patient Instructions (Signed)
We referred you for routine colon cancer screening (colonoscopy).  You have internal hemorrhoids. Use the suppositories twice daily until better.    Hemorroides Hemorrhoids Las hemorroides son venas inflamadas adentro o alrededor del recto o del ano. Hay dos tipos de hemorroides:  Hemorroides internas. Se forman en las venas del interior del recto. Pueden abultarse hacia afuera, irritarse y doler.  Hemorroides externas. Se producen en las venas externas del ano y pueden sentirse como un bulto o zona hinchada, dura y dolorosa cerca del ano. Paradis hemorroides no causan problemas graves y se Engineer, petroleum con tratamientos caseros [employer] cambios en la dieta y el estilo de vida. Si los tratamientos caseros no ayudan con los sntomas, se pueden Optometrist procedimientos para reducir o extirpar las hemorroides. Cules son las causas? La causa de esta afeccin es el aumento de la presin en la zona anal. Esta presin puede ser causada por distintos factores, por ejemplo:  Estreimiento.  Hacer un gran esfuerzo para defecar.  Diarrea.  Embarazo.  Obesidad.  Estar sentado durante largos perodos de [place].  Levantar objetos pesados u otras actividades que impliquen esfuerzo.  Sexo anal.  Andar en bicicleta por un largo perodo de tiempo. Cules son los signos o los sntomas? Los sntomas de esta afeccin incluyen los siguientes:  Social research officer, government.  Picazn o irritacin anal.  Sangrado rectal.  Prdida de materia fecal (heces).  Inflamacin anal.  Uno o ms bultos alrededor del ano. Cmo se diagnostica? Esta afeccin se diagnostica frecuentemente a travs de un examen visual. Posiblemente le realicen otros tipos de pruebas o estudios, como los siguientes:  Un examen que implica palpar el rea rectal con la mano enguantada (examen rectal digital).  Un examen del canal anal que se realiza utilizando un pequeo tubo (anoscopio).  Anlisis de sangre si ha perdido [place]  cantidad significativa de [place].  Una prueba que consiste en la observacin del interior del colon utilizando un tubo flexible con una cmara en el extremo (sigmoidoscopia o colonoscopa). Cmo se trata? Esta afeccin generalmente se puede tratar en el hogar. Sin embargo, se pueden TEFL teacher procedimientos si los cambios en la dieta, en el estilo de vida y otros tratamientos caseros no [employer] sntomas. Estos procedimientos pueden ayudar a reducir o [place] hemorroides completamente. Algunos de estos procedimientos son quirrgicos y otros no. Algunos de los procedimientos ms frecuentes son los siguientes:  Ligadura con Forensic psychologist. Las bandas elsticas se colocan en la base de las hemorroides para interrumpir su irrigacin de [place].  Escleroterapia. Se inyecta un medicamento en las hemorroides para reducir su tamao.  Coagulacin con luz infrarroja. Se utiliza un tipo de energa lumnica para eliminar las hemorroides.  Hemorroidectoma. Las hemorroides se extirpan con [place] y las venas que las [place] se IT consultant.  Hemorroidopexia con grapas. El cirujano engrapa la base de las hemorroides a la pared del recto. Siga estas indicaciones en su casa: Comida y bebida  Consuma alimentos con alto contenido de [place], como cereales integrales, porotos, frutos secos, frutas y verduras.  Pregntele a su mdico acerca de tomar productos con fibra aadida en ellos (complementos de fibra).  Disminuya la cantidad de grasa de la dieta. Esto se puede lograr consumiendo productos lcteos con bajo contenido de grasas, ingiriendo menor cantidad de carnes rojas y evitando los alimentos procesados.  Beba suficiente lquido como para Theatre manager la orina de color amarillo plido.   Control del dolor y [place] asiento tibios [place]  20 minutos, 3 o 4 veces por da para Glass blower/designer y las [employer]. Puede hacer esto en una baera o usar un dispositivo porttil para bao de  asiento que se coloca sobre el inodoro.  Si se lo indican, aplique hielo en la zona afectada. Usar compresas de [employer] baos de asiento puede ser [place]. ? Ponga el hielo en una bolsa plstica. ? Coloque una [employer] piel y Therapist, nutritional. ? Coloque el hielo durante 70minutos, 2 a 3veces por da.   Indicaciones generales  [employer]phi de venta libre y los recetados solamente como se lo haya indicado el mdico.  Aplquese los medicamentos, cremas o supositorios como se lo hayan indicado.  Haga ejercicio con regularidad. Consulte al mdico qu cantidad y qu tipo de ejercicio es mejor para usted. En general, debe realizar al menos 59minutos de ejercicio moderado la [employer] de la semana (150 minutos cada semana). Esto puede incluir [employer], andar en bicicleta o practicar yoga.  Vaya al bao cuando sienta la necesidad de defecar. No espere.  Evite hacer fuerza en las deposiciones.  Mantenga la zona anal limpia y seca. Use papel higinico hmedo o toallitas humedecidas despus de las deposiciones.  No pase mucho tiempo sentado en el inodoro. Esto aumenta la afluencia de sangre y Conservation officer, historic buildings.  Concurra a todas las visitas de seguimiento como se lo haya indicado el mdico. Esto es importante. Comunquese con un mdico si tiene:  Aumento del dolor y la hinchazn que no puede controlar con medicamentos o Clinical research associate.  No puede defecar o lo hace con dificultad.  Dolor o tiene inflamacin fuera de la zona de las hemorroides. Solicite ayuda inmediatamente si tiene:  Hemorragia descontrolada en el recto. Resumen  Las hemorroides son venas inflamadas adentro o alrededor del recto o del ano.  La mayora de las hemorroides se pueden controlar con tratamientos caseros como cambios en la dieta y el estilo de vida.  Tomar baos de asiento con agua tibia puede ayudar a Best boy y las [place].  En los casos graves, se pueden realizar  procedimientos o Ardelia Mems ciruga para reducir o [place] hemorroides. Esta informacin no tiene Marine scientist el consejo del mdico. Asegrese de hacerle al mdico cualquier pregunta que tenga. Document Revised: 11/27/2017 Document Reviewed: 11/27/2017 Elsevier Patient Education  2021 [employer].

## 2020-11-01 ENCOUNTER — Ambulatory Visit (INDEPENDENT_AMBULATORY_CARE_PROVIDER_SITE_OTHER): Payer: 59 | Admitting: Gastroenterology

## 2020-11-01 ENCOUNTER — Encounter: Payer: Self-pay | Admitting: Gastroenterology

## 2020-11-01 VITALS — BP 94/66 | HR 53 | Ht 70.0 in | Wt 183.0 lb

## 2020-11-01 DIAGNOSIS — Z1211 Encounter for screening for malignant neoplasm of colon: Secondary | ICD-10-CM | POA: Diagnosis not present

## 2020-11-01 DIAGNOSIS — K625 Hemorrhage of anus and rectum: Secondary | ICD-10-CM | POA: Diagnosis not present

## 2020-11-01 MED ORDER — PLENVU 140 G PO SOLR: 140.0000 g | ORAL | 0 refills | Status: DC

## 2020-11-01 NOTE — Progress Notes (Signed)
[facility] Consult Note:  History: Erik Bautista 11/01/2020  Referring provider: Girtha Rm, NP-C  Reason for consult/chief complaint: Colon Cancer Screening (Patient had blood in stool 3 weeks prior, also hx of hemorrhoids.)   Subjective  HPI:  This is a very pleasant 55 year old man referred by primary care for colon cancer screening, and also for some recent rectal bleeding.  Several weeks ago he had a brief period of some blood on the toilet paper, no dyschezia abdominal pain or change in bowel habits.  No recurrence since then, he believes he was told in the past he has hemorrhoids.  He had initially thought that perhaps he had had some kind of colon cancer screening test while living in [place], when I described a sigmoidoscopy or colonoscopy, was certain that he had not had that done. He has no chronic abdominal pain or altered bowel habits. Several years of intermittent heartburn, responds well to as needed acid suppression medicine.  Denies dysphagia, odynophagia, nausea vomiting early satiety or weight loss.  ROS:  Review of Systems  Constitutional: Negative for appetite change and unexpected weight change.  HENT: Negative for mouth sores and voice change.   Eyes: Negative for pain and redness.  Respiratory: Negative for cough and shortness of breath.   Cardiovascular: Negative for chest pain and palpitations.  Genitourinary: Negative for dysuria and hematuria.  Musculoskeletal: Negative for arthralgias and myalgias.  Skin: Negative for pallor and rash.  Neurological: Negative for weakness and headaches.       Intermittent carpal tunnel symptoms  Hematological: Negative for adenopathy.     Past Medical History: Past Medical History:  Diagnosis Date  . Carpal tunnel syndrome   . Depression   . HIV infection ([place])   . Hyperlipidemia   . Insomnia   . Spinal stenosis    I reviewed most recent primary care note from May 12 and infectious  disease note from April 26 (HIV stable, good compliance, CD4 944, viral load undetected)  Past Surgical History: Past Surgical History:  Procedure Laterality Date  . WRIST ARTHROPLASTY       Family History: History reviewed. No pertinent family history. No known family history of colon or rectal cancer.  Social History: Social History   Socioeconomic History  . Marital status: Single    Spouse name: Not on file  . Number of children: Not on file  . Years of education: Not on file  . Highest education level: Not on file  Occupational History  . Not on file  Tobacco Use  . Smoking status: Former Smoker    Quit date: 06/03/1988    Years since quitting: 32.4  . Smokeless tobacco: Never Used  Substance and Sexual Activity  . Alcohol use: No    Alcohol/week: 0.0 standard drinks  . Drug use: No    Types: Marijuana    Comment: monthly  . Sexual activity: Yes    Comment: pt. given condoms  Other Topics Concern  . Not on file  Social History Narrative  . Not on file   Social Determinants of Health   Financial Resource Strain: Not on file  Food Insecurity: Not on file  Transportation Needs: Not on file  Physical Activity: Not on file  Stress: Not on file  Social Connections: Not on Microbiologist Originally from [place]  Allergies: No Known Allergies  Outpatient Meds: Current Outpatient Medications  Medication Sig Dispense Refill  . bictegravir-emtricitabine-tenofovir AF (BIKTARVY) 50-200-25 MG TABS tablet  Take 1 tablet by mouth daily. 30 tablet 11  . calcium carbonate (TUMS - DOSED IN MG ELEMENTAL CALCIUM) 500 MG chewable tablet Chew 2 tablets by mouth as needed for indigestion or heartburn.    . Multiple Vitamins-Minerals (MENS 50+ MULTI VITAMIN/MIN PO) Take 1 tablet by mouth daily.    Marland Kitchen PEG-KCl-NaCl-NaSulf-Na Asc-C (PLENVU) 140 g SOLR Take 140 g by mouth as directed. Manufacturer's coupon Universal coupon code:BIN: P2366821; GROUP: [id number]; PCN: [id number];  ID: [id number]; PAY NO MORE $50 1 each 0  . traZODone (DESYREL) 50 MG tablet TAKE 1 TABLET(50 MG) BY MOUTH AT BEDTIME AS NEEDED FOR SLEEP 30 tablet 11  . hydrocortisone (ANUSOL-HC) 25 MG suppository Place 1 suppository (25 mg total) rectally 2 (two) times daily. (Patient not taking: Reported on 11/01/2020) 12 suppository 0   No current facility-administered medications for this visit.      ___________________________________________________________________ Objective   Exam:  BP 94/66   Pulse (!) 53   Ht 5\' 10"  (1.778 m)   Wt 183 lb (83 kg)   BMI 26.26 kg/m  Wt Readings from Last 3 Encounters:  11/01/20 183 lb (83 kg)  10/12/20 180 lb (81.6 kg)  09/26/20 184 lb (83.5 kg)     General: He is well-appearing  Eyes: sclera anicteric, no redness  ENT: oral mucosa moist without lesions, no cervical or supraclavicular lymphadenopathy  CV: RRR with a soft murmur, S1/S2, no JVD, no peripheral edema  Resp: clear to auscultation bilaterally, normal RR and effort noted  GI: soft, no tenderness, with active bowel sounds. No guarding or palpable organomegaly noted.  Skin; warm and dry, no rash or jaundice noted  Neuro: awake, alert and oriented x 3. Normal gross motor function and fluent speech Rectal: Normal external, normal sphincter tone, no palpable internal lesions, soft light brown stool. Labs:  CBC Latest Ref Rng & Units 09/05/2020 02/25/2020 06/10/2018  WBC 3.8 - 10.8 Thousand/uL 6.9 6.5 5.8  Hemoglobin 13.2 - 17.1 g/dL 14.1 15.0 16.0  Hematocrit 38.5 - 50.0 % 41.8 43.7 46.2  Platelets 140 - 400 Thousand/uL 288 315 353     Assessment: Encounter Diagnoses  Name Primary?  . Rectal bleeding Yes  . Special screening for malignant neoplasms, colon     Average risk colorectal cancer.  He has been interested in having a colonoscopy in the past, but had not pursued it because he understood he needed a care partner.  He lives alone and does not have any when he thinks could fill  that role for him.  I explained there are care partner services for higher and we can give him that information, and he was very interested to investigate that.  He therefore wanted to schedule a screening colonoscopy with [place].  Risks and benefits were reviewed.  The benefits and risks of the planned procedure were described in detail with the patient or (when appropriate) their health care proxy.  Risks were outlined as including, but not limited to, bleeding, infection, perforation, adverse medication reaction leading to cardiac or pulmonary decompensation, pancreatitis (if ERCP).  The limitation of incomplete mucosal visualization was also discussed.  No guarantees or warranties were given.  Recent self-limited painless rectal bleeding, manifest as blood on the toilet paper, probably hemorrhoidal.  Will be investigated at time of colonoscopy.  Thank you for the courtesy of this consult.  Please call me with any questions or concerns.  Nelida Meuse III  CC: Referring provider noted above

## 2020-11-01 NOTE — Patient Instructions (Addendum)
If you are age 57 or older, your body mass index should be between 23-30. Your Body mass index is 26.26 kg/m. If this is out of the aforementioned range listed, please consider follow up with your Primary Care Provider.  If you are age 110 or younger, your body mass index should be between 19-25. Your Body mass index is 26.26 kg/m. If this is out of the aformentioned range listed, please consider follow up with your Primary Care Provider.   __________________________________________________________  The [facility] providers would like to encourage you to use [facility] to communicate with providers for non-urgent requests or questions.  Due to long hold times on the telephone, sending your provider a message by [facility] may be a faster and more efficient way to get a response.  Please allow 48 business hours for a response.  Please remember that this is for non-urgent requests.   You have been scheduled for a colonoscopy. Please follow written instructions given to you at your visit today.  Please pick up your prep supplies at the pharmacy within the next 1-3 days. If you use inhalers (even only as needed), please bring them with you on the day of your procedure.  It was a pleasure to see you today!  Thank you for trusting me with your gastrointestinal care!

## 2020-11-13 ENCOUNTER — Other Ambulatory Visit: Payer: Self-pay

## 2020-11-13 ENCOUNTER — Encounter: Payer: Self-pay | Admitting: Gastroenterology

## 2020-11-13 ENCOUNTER — Ambulatory Visit (AMBULATORY_SURGERY_CENTER): Payer: 59 | Admitting: Gastroenterology

## 2020-11-13 VITALS — BP 106/71 | HR 59 | Temp 98.5°F | Resp 13 | Ht 70.0 in | Wt 183.0 lb

## 2020-11-13 DIAGNOSIS — Z1211 Encounter for screening for malignant neoplasm of colon: Secondary | ICD-10-CM

## 2020-11-13 MED ORDER — SODIUM CHLORIDE 0.9 % IV SOLN: 500.0000 mL | INTRAVENOUS | Status: DC

## 2020-11-13 NOTE — Progress Notes (Signed)
Pt's states no medical or surgical changes since previsit or office visit.   Cw vitals and HC IV.

## 2020-11-13 NOTE — Patient Instructions (Signed)
Discharge instructions given. Handout on Diverticulosis. Resume previous medications. YOU HAD AN ENDOSCOPIC PROCEDURE TODAY AT [facility]:   Refer to the procedure report that was given to you for any specific questions about what was found during the examination.  If the procedure report does not answer your questions, please call your gastroenterologist to clarify.  If you requested that your care partner not be given the details of your procedure findings, then the procedure report has been included in a sealed envelope for you to review at your convenience later.  YOU SHOULD EXPECT: Some feelings of bloating in the abdomen. Passage of more gas than usual.  Walking can help get rid of the air that was put into your GI tract during the procedure and reduce the bloating. If you had a lower endoscopy (such as a colonoscopy or flexible sigmoidoscopy) you may notice spotting of blood in your stool or on the toilet paper. If you underwent a bowel prep for your procedure, you may not have a normal bowel movement for a few days.  Please Note:  You might notice some irritation and congestion in your nose or some drainage.  This is from the oxygen used during your procedure.  There is no need for concern and it should clear up in a day or so.  SYMPTOMS TO REPORT IMMEDIATELY:  Following lower endoscopy (colonoscopy or flexible sigmoidoscopy):  Excessive amounts of blood in the stool  Significant tenderness or worsening of abdominal pains  Swelling of the abdomen that is new, acute  Fever of 100F or higher   For urgent or emergent issues, a gastroenterologist can be reached at any hour by calling [phone]. Do not use MyChart messaging for urgent concerns.    DIET:  We do recommend a small meal at first, but then you may proceed to your regular diet.  Drink plenty of fluids but you should avoid alcoholic beverages for 24 hours.  ACTIVITY:  You should plan to take it easy for  the rest of today and you should NOT DRIVE or use heavy machinery until tomorrow (because of the sedation medicines used during the test).    FOLLOW UP: Our staff will call the number listed on your records 48-72 hours following your procedure to check on you and address any questions or concerns that you may have regarding the information given to you following your procedure. If we do not reach you, we will leave a message.  We will attempt to reach you two times.  During this call, we will ask if you have developed any symptoms of COVID 19. If you develop any symptoms (ie: fever, flu-like symptoms, shortness of breath, cough etc.) before then, please call [phone].  If you test positive for Covid 19 in the 2 weeks post procedure, please call and report this information to [place].    If any biopsies were taken you will be contacted by phone or by letter within the next 1-3 weeks.  Please call us at [phone] if you have not heard about the biopsies in 3 weeks.    SIGNATURES/CONFIDENTIALITY: You and/or your care partner have signed paperwork which will be entered into your electronic medical record.  These signatures attest to the fact that that the information above on your After Visit Summary has been reviewed and is understood.  Full responsibility of the confidentiality of this discharge information lies with you and/or your care-partner.

## 2020-11-13 NOTE — Progress Notes (Signed)
pt tolerated well. VSS. awake and to recovery. Report given to RN.  

## 2020-11-13 NOTE — Op Note (Signed)
[name]: Erik Bautista Procedure Date: 11/13/2020 2:29 PM MRN: [id number] Endoscopist: Mallie Mussel L. [name] , MD Age: 55 Referring MD:  Date of Birth: 1966/04/27 Gender: Male Account #: [id number] Procedure:                Colonoscopy Indications:              Screening for colorectal malignant neoplasm, This                            is the patient's first colonoscopy                           (patient recently had a single episode of painless                            blood on paper after BM) Medicines:                Monitored Anesthesia Care Procedure:                Pre-Anesthesia Assessment:                           - Prior to the procedure, a History and Physical                            was performed, and patient medications and                            allergies were reviewed. The patient's tolerance of                            previous anesthesia was also reviewed. The risks                            and benefits of the procedure and the sedation                            options and risks were discussed with the patient.                            All questions were answered, and informed consent                            was obtained. Prior Anticoagulants: The patient has                            taken no previous anticoagulant or antiplatelet                            agents. ASA Grade Assessment: III - A patient with                            severe systemic disease. After reviewing the risks  and benefits, the patient was deemed in                            satisfactory condition to undergo the procedure.                           After obtaining informed consent, the colonoscope                            was passed under direct vision. Throughout the                            procedure, the patient's blood pressure, pulse, and                            oxygen saturations were monitored continuously. The                             Olympus CF-[id number]) Colonoscope was                            introduced through the anus and advanced to the the                            cecum, identified by appendiceal orifice and                            ileocecal valve. The colonoscopy was performed                            without difficulty. The patient tolerated the                            procedure well. The quality of the bowel                            preparation was good. The ileocecal valve,                            appendiceal orifice, and rectum were photographed.                            The bowel preparation used was Plenvu. Scope In: 2:39:17 PM Scope Out: 2:53:24 PM Scope Withdrawal Time: 0 hours 10 minutes 56 seconds  Total Procedure Duration: 0 hours 14 minutes 7 seconds  Findings:                 The perianal and digital rectal examinations were                            normal.                           Multiple diverticula were found in the left colon  and right colon (L>R).                           The exam was otherwise without abnormality on                            direct and retroflexion views. Complications:            No immediate complications. Estimated Blood Loss:     Estimated blood loss: none. Impression:               - Diverticulosis in the left colon.                           - The examination was otherwise normal on direct                            and retroflexion views.                           - No specimens collected.                           Benign anal bleeding. Recommendation:           - Patient has a contact number available for                            emergencies. The signs and symptoms of potential                            delayed complications were discussed with the                            patient. Return to normal activities tomorrow.                            Written discharge instructions  were provided to the                            patient.                           - Resume previous diet.                           - Continue present medications.                           - Repeat colonoscopy in 10 years for screening                            purposes. Amenah Tucci L. [name], MD 11/13/2020 2:59:56 PM This report has been signed electronically.

## 2020-11-15 ENCOUNTER — Telehealth: Payer: Self-pay

## 2020-11-15 NOTE — Telephone Encounter (Signed)
Left message on follow up call. 

## 2021-03-20 ENCOUNTER — Other Ambulatory Visit (INDEPENDENT_AMBULATORY_CARE_PROVIDER_SITE_OTHER): Payer: 59

## 2021-03-20 ENCOUNTER — Other Ambulatory Visit: Payer: Self-pay

## 2021-03-20 DIAGNOSIS — Z23 Encounter for immunization: Secondary | ICD-10-CM

## 2021-10-19 ENCOUNTER — Other Ambulatory Visit: Payer: Self-pay | Admitting: Internal Medicine

## 2021-10-19 ENCOUNTER — Other Ambulatory Visit: Payer: Self-pay

## 2021-10-19 DIAGNOSIS — B20 Human immunodeficiency virus [HIV] disease: Secondary | ICD-10-CM

## 2021-10-19 DIAGNOSIS — Z113 Encounter for screening for infections with a predominantly sexual mode of transmission: Secondary | ICD-10-CM

## 2021-10-22 ENCOUNTER — Other Ambulatory Visit: Payer: 59

## 2021-10-22 ENCOUNTER — Other Ambulatory Visit (HOSPITAL_COMMUNITY)
Admission: RE | Admit: 2021-10-22 | Discharge: 2021-10-22 | Disposition: A | Discharge status: Home / Self Care | Payer: 59 | Source: Ambulatory Visit | Attending: Internal Medicine | Admitting: Internal Medicine

## 2021-10-22 ENCOUNTER — Other Ambulatory Visit: Payer: Self-pay

## 2021-10-22 DIAGNOSIS — Z113 Encounter for screening for infections with a predominantly sexual mode of transmission: Secondary | ICD-10-CM

## 2021-10-22 DIAGNOSIS — B20 Human immunodeficiency virus [HIV] disease: Secondary | ICD-10-CM | POA: Diagnosis present

## 2021-10-23 LAB — URINE CYTOLOGY ANCILLARY ONLY
Chlamydia: NEGATIVE
Comment: NEGATIVE
Comment: NORMAL
Neisseria Gonorrhea: NEGATIVE

## 2021-10-23 LAB — T-HELPER CELL (CD4) - (RCID CLINIC ONLY)
CD4 % Helper T Cell: 35 % (ref 33–65)
CD4 T Cell Abs: 681 /uL (ref 400–1790)

## 2021-10-24 ENCOUNTER — Ambulatory Visit: Payer: 59 | Admitting: Internal Medicine

## 2021-10-24 LAB — COMPLETE METABOLIC PANEL WITH GFR
AG Ratio: 1.4 (calc) (ref 1.0–2.5)
ALT: 16 U/L (ref 9–46)
AST: 15 U/L (ref 10–35)
Albumin: 4.5 g/dL (ref 3.6–5.1)
Alkaline phosphatase (APISO): 61 U/L (ref 35–144)
BUN: 20 mg/dL (ref 7–25)
CO2: 25 mmol/L (ref 20–32)
Calcium: 9.5 mg/dL (ref 8.6–10.3)
Chloride: 106 mmol/L (ref 98–110)
Creat: 1.26 mg/dL (ref 0.70–1.30)
Globulin: 3.3 g/dL (calc) (ref 1.9–3.7)
Glucose, Bld: 102 mg/dL — ABNORMAL HIGH (ref 65–99)
Potassium: 4.1 mmol/L (ref 3.5–5.3)
Sodium: 140 mmol/L (ref 135–146)
Total Bilirubin: 0.8 mg/dL (ref 0.2–1.2)
Total Protein: 7.8 g/dL (ref 6.1–8.1)
eGFR: 67 mL/min/{1.73_m2} (ref >=60)

## 2021-10-24 LAB — CBC WITH DIFFERENTIAL/PLATELET
Absolute Monocytes: 490 cells/uL (ref 200–950)
Basophils Absolute: 28 cells/uL (ref 0–200)
Basophils Relative: 0.5 %
Eosinophils Absolute: 171 cells/uL (ref 15–500)
Eosinophils Relative: 3.1 %
HCT: 42.9 % (ref 38.5–50.0)
Hemoglobin: 14.6 g/dL (ref 13.2–17.1)
Lymphs Abs: 2156 cells/uL (ref 850–3900)
MCH: 30.4 pg (ref 27.0–33.0)
MCHC: 34 g/dL (ref 32.0–36.0)
MCV: 89.2 fL (ref 80.0–100.0)
MPV: 9.8 fL (ref 7.5–12.5)
Monocytes Relative: 8.9 %
Neutro Abs: 2657 cells/uL (ref 1500–7800)
Neutrophils Relative %: 48.3 %
Platelets: 292 10*3/uL (ref 140–400)
RBC: 4.81 10*6/uL (ref 4.20–5.80)
RDW: 13.7 % (ref 11.0–15.0)
Total Lymphocyte: 39.2 %
WBC: 5.5 10*3/uL (ref 3.8–10.8)

## 2021-10-24 LAB — LIPID PANEL
Cholesterol: 235 mg/dL — ABNORMAL HIGH (ref <200)
HDL: 53 mg/dL (ref >=40)
LDL Cholesterol (Calc): 150 mg/dL (calc) — ABNORMAL HIGH
Non-HDL Cholesterol (Calc): 182 mg/dL (calc) — ABNORMAL HIGH (ref <130)
Total CHOL/HDL Ratio: 4.4 (calc) (ref <5.0)
Triglycerides: 184 mg/dL — ABNORMAL HIGH (ref <150)

## 2021-10-24 LAB — RPR: RPR Ser Ql: REACTIVE — AB

## 2021-10-24 LAB — HIV-1 RNA QUANT-NO REFLEX-BLD
HIV 1 RNA Quant: NOT DETECTED copies/mL
HIV-1 RNA Quant, Log: NOT DETECTED Log copies/mL

## 2021-10-24 LAB — FLUORESCENT TREPONEMAL AB(FTA)-IGG-BLD: Fluorescent Treponemal ABS: REACTIVE — AB

## 2021-10-24 LAB — RPR TITER: RPR Titer: 1:1 {titer} — ABNORMAL HIGH

## 2021-10-31 ENCOUNTER — Encounter: Payer: Self-pay | Admitting: Internal Medicine

## 2021-10-31 ENCOUNTER — Other Ambulatory Visit: Payer: Self-pay

## 2021-10-31 ENCOUNTER — Ambulatory Visit (INDEPENDENT_AMBULATORY_CARE_PROVIDER_SITE_OTHER): Payer: 59 | Admitting: Internal Medicine

## 2021-10-31 VITALS — BP 114/72 | HR 57 | Temp 97.9°F | Wt 185.4 lb

## 2021-10-31 DIAGNOSIS — G4700 Insomnia, unspecified: Secondary | ICD-10-CM

## 2021-10-31 DIAGNOSIS — Z113 Encounter for screening for infections with a predominantly sexual mode of transmission: Secondary | ICD-10-CM | POA: Diagnosis not present

## 2021-10-31 DIAGNOSIS — Z5181 Encounter for therapeutic drug level monitoring: Secondary | ICD-10-CM

## 2021-10-31 DIAGNOSIS — B20 Human immunodeficiency virus [HIV] disease: Secondary | ICD-10-CM | POA: Diagnosis not present

## 2021-10-31 MED ORDER — TRAZODONE HCL 50 MG PO TABS: ORAL_TABLET | ORAL | 11 refills | Status: DC

## 2021-10-31 MED ORDER — BIKTARVY 50-200-25 MG PO TABS: 1.0000 | ORAL_TABLET | Freq: Every day | ORAL | 11 refills | Status: DC

## 2021-10-31 NOTE — Assessment & Plan Note (Signed)
Will refill the Trazodone

## 2021-10-31 NOTE — Assessment & Plan Note (Signed)
Creat stable, wnl.

## 2021-10-31 NOTE — Assessment & Plan Note (Signed)
He is doing well now and no concerns.  He will continue with Biktarvy and can rtc in 6 months.

## 2021-10-31 NOTE — Assessment & Plan Note (Signed)
Screened negative 

## 2021-10-31 NOTE — Progress Notes (Signed)
   Subjective:    Patient ID: Erik Bautista, male    DOB: 1966-04-08, 56 y.o.   MRN: [id number]  HPI Here for follow up for HIV Last seen 1 year ago and has had no significant issues.  Only a couple of missed doses.  No new concerns and feels pretty well overall.  No issues with getting, taking or tolerating his medication.     Review of Systems  Constitutional:  Negative for fatigue.  Gastrointestinal:  Negative for diarrhea and nausea.  Skin:  Negative for rash.      Objective:   Physical Exam Eyes:     General: No scleral icterus. Pulmonary:     Effort: Pulmonary effort is normal.  Skin:    Findings: No rash.  Neurological:     Mental Status: He is alert.  Psychiatric:        Mood and Affect: Mood normal.   SH: no tobacco       Assessment & Plan:

## 2021-12-24 ENCOUNTER — Encounter: Payer: Self-pay | Admitting: Internal Medicine

## 2021-12-31 ENCOUNTER — Other Ambulatory Visit: Payer: Self-pay | Admitting: Internal Medicine

## 2021-12-31 DIAGNOSIS — B20 Human immunodeficiency virus [HIV] disease: Secondary | ICD-10-CM

## 2022-01-03 ENCOUNTER — Other Ambulatory Visit: Payer: Self-pay

## 2022-01-03 DIAGNOSIS — B20 Human immunodeficiency virus [HIV] disease: Secondary | ICD-10-CM

## 2022-01-03 MED ORDER — BIKTARVY 50-200-25 MG PO TABS: 1.0000 | ORAL_TABLET | Freq: Every day | ORAL | 0 refills | Status: DC

## 2022-01-29 ENCOUNTER — Other Ambulatory Visit: Payer: Self-pay | Admitting: Internal Medicine

## 2022-01-29 DIAGNOSIS — B20 Human immunodeficiency virus [HIV] disease: Secondary | ICD-10-CM

## 2022-03-29 ENCOUNTER — Encounter: Payer: Self-pay | Admitting: Family Medicine

## 2022-03-29 ENCOUNTER — Ambulatory Visit (INDEPENDENT_AMBULATORY_CARE_PROVIDER_SITE_OTHER): Payer: 59 | Admitting: Family Medicine

## 2022-03-29 VITALS — BP 118/80 | HR 64 | Temp 98.5°F | Wt 184.8 lb

## 2022-03-29 DIAGNOSIS — Z23 Encounter for immunization: Secondary | ICD-10-CM | POA: Diagnosis not present

## 2022-03-29 DIAGNOSIS — K219 Gastro-esophageal reflux disease without esophagitis: Secondary | ICD-10-CM

## 2022-03-29 NOTE — Patient Instructions (Signed)
Try either Axid, Pepcid or Zantac but double the dose for 2 weeks to see if that will get it under control.  If it gets under control then back it off to just regular dosing and if that works then go to every other day if that does not work call me and we will talk on the next medicine that we can use

## 2022-03-29 NOTE — Progress Notes (Signed)
   Subjective:    Patient ID: Erik Bautista, male    DOB: Sep 14, 1965, 56 y.o.   MRN: [id number]  HPI He states that he has had difficulty with indigestion for the last 5 years that he uses Tums work with fairly good success however within the last month or so he has noted no improvement with Tums.  The symptoms do get worse when he lies down.  He has cut back on eating [place] food to see if that is helpful which is has helped slightly.   Review of Systems     Objective:   Physical Exam Alert and in no distress.  TMs normal.  Throat clear.  Cardiac exam shows regular rhythm without murmurs gallops.  Lungs are clear to auscultation.  Abdominal exam is benign.       Assessment & Plan:  Gastroesophageal reflux disease, unspecified whether esophagitis present Try either Axid, Pepcid or Zantac but double the dose for 2 weeks to see if that will get it under control.  If it gets under control then back it off to just regular dosing and if that works then go to every other day if that does not work call me and we will talk on the next medicine that we can use

## 2022-04-17 ENCOUNTER — Encounter: Payer: Self-pay | Admitting: Internal Medicine

## 2022-05-11 ENCOUNTER — Other Ambulatory Visit: Payer: Self-pay | Admitting: Internal Medicine

## 2022-05-11 DIAGNOSIS — B20 Human immunodeficiency virus [HIV] disease: Secondary | ICD-10-CM

## 2022-06-04 ENCOUNTER — Ambulatory Visit: Payer: 59 | Admitting: Internal Medicine

## 2022-06-12 ENCOUNTER — Ambulatory Visit (INDEPENDENT_AMBULATORY_CARE_PROVIDER_SITE_OTHER): Payer: 59 | Admitting: Internal Medicine

## 2022-06-12 ENCOUNTER — Other Ambulatory Visit (HOSPITAL_COMMUNITY): Payer: Self-pay

## 2022-06-12 ENCOUNTER — Other Ambulatory Visit: Payer: Self-pay

## 2022-06-12 ENCOUNTER — Encounter: Payer: Self-pay | Admitting: Internal Medicine

## 2022-06-12 VITALS — BP 115/72 | HR 86 | Resp 16 | Ht 70.0 in | Wt 185.0 lb

## 2022-06-12 DIAGNOSIS — B20 Human immunodeficiency virus [HIV] disease: Secondary | ICD-10-CM | POA: Diagnosis not present

## 2022-06-12 DIAGNOSIS — F321 Major depressive disorder, single episode, moderate: Secondary | ICD-10-CM | POA: Diagnosis not present

## 2022-06-12 DIAGNOSIS — Z5181 Encounter for therapeutic drug level monitoring: Secondary | ICD-10-CM

## 2022-06-12 MED ORDER — ROSUVASTATIN CALCIUM 10 MG PO TABS: 10.0000 mg | ORAL_TABLET | Freq: Every day | ORAL | 11 refills | Status: DC

## 2022-06-12 MED ORDER — BIKTARVY 50-200-25 MG PO TABS: 1.0000 | ORAL_TABLET | Freq: Every day | ORAL | 11 refills | Status: DC

## 2022-06-12 NOTE — Assessment & Plan Note (Signed)
Much improved now and no new concerns.

## 2022-06-12 NOTE — Progress Notes (Signed)
   Subjective:    Patient ID: Erik Bautista, male    DOB: May 19, 1966, 57 y.o.   MRN: [id number]  HPI Here for follow up of HiV He has continued on Biktarvy.  He does endorse occasional missed doses.  No new issues.  He tells me he is feeling really well, no significant depression now and attributes his improved mood with affects of counseling.    Review of Systems  Constitutional:  Negative for fatigue.  Gastrointestinal:  Negative for diarrhea and nausea.  Skin:  Negative for rash.       Objective:   Physical Exam Eyes:     General: No scleral icterus. Pulmonary:     Effort: Pulmonary effort is normal.  Skin:    Findings: No rash.  Neurological:     Mental Status: He is alert.  Psychiatric:        Mood and Affect: Mood normal.    SH: no tobacco      Assessment & Plan:

## 2022-06-12 NOTE — Assessment & Plan Note (Signed)
Starting a statin so will check his LFTs as a baseline

## 2022-06-12 NOTE — Assessment & Plan Note (Addendum)
He continues to do well and no concerns.  Will verify with labs and he will continue with Biktarvy.  Follow up in 6 months.   With new research coming out by way of the Perkins study regarding cardiovascular event risk reduction for PLWH, I discussed that over the 8 year trial initiation of statin (pitavastatin) therapy was shown to reduce CV disease by 35% independent of other risk factors.   The 10-year ASCVD risk score (Arnett DK, et al., 2019) is: 6.8%   Values used to calculate the score:     Age: 57 years     Sex: Male     Is Non-Hispanic African American: No     Diabetic: No     Tobacco smoker: No     Systolic Blood Pressure: 383 mmHg     Is BP treated: No     HDL Cholesterol: 53 mg/dL     Total Cholesterol: 235 mg/dL  We discussed the patient's 10-year CVD Risk Score to be at least moderately elevated, and would benefit from intervention given HIV+ and > 2 yo.   Will proceed with lower-intensity statin given non-diabetic.

## 2022-06-13 ENCOUNTER — Other Ambulatory Visit: Payer: Self-pay | Admitting: Internal Medicine

## 2022-06-13 DIAGNOSIS — B20 Human immunodeficiency virus [HIV] disease: Secondary | ICD-10-CM

## 2022-06-13 LAB — T-HELPER CELL (CD4) - (RCID CLINIC ONLY)
CD4 % Helper T Cell: 41 % (ref 33–65)
CD4 T Cell Abs: 819 /uL (ref 400–1790)

## 2022-06-15 LAB — HEPATIC FUNCTION PANEL
AG Ratio: 1.4 (calc) (ref 1.0–2.5)
ALT: 19 U/L (ref 9–46)
AST: 13 U/L (ref 10–35)
Albumin: 4.4 g/dL (ref 3.6–5.1)
Alkaline phosphatase (APISO): 77 U/L (ref 35–144)
Bilirubin, Direct: 0.1 mg/dL (ref 0.0–0.2)
Globulin: 3.2 g/dL (calc) (ref 1.9–3.7)
Indirect Bilirubin: 0.4 mg/dL (calc) (ref 0.2–1.2)
Total Bilirubin: 0.5 mg/dL (ref 0.2–1.2)
Total Protein: 7.6 g/dL (ref 6.1–8.1)

## 2022-06-15 LAB — HIV-1 RNA QUANT-NO REFLEX-BLD
HIV 1 RNA Quant: NOT DETECTED Copies/mL
HIV-1 RNA Quant, Log: NOT DETECTED Log cps/mL

## 2023-01-23 ENCOUNTER — Encounter: Payer: Self-pay | Admitting: Family Medicine

## 2023-01-23 ENCOUNTER — Ambulatory Visit (INDEPENDENT_AMBULATORY_CARE_PROVIDER_SITE_OTHER): Payer: 59 | Admitting: Family Medicine

## 2023-01-23 VITALS — BP 128/88 | HR 65 | Temp 97.9°F | Resp 16 | Wt 184.2 lb

## 2023-01-23 DIAGNOSIS — M7581 Other shoulder lesions, right shoulder: Secondary | ICD-10-CM

## 2023-01-23 NOTE — Patient Instructions (Signed)
Take 2 Aleve twice per day for the next 2 weeks.  If it still bothering you then set up another appointment and what I will probably do then is to give you a shot

## 2023-01-23 NOTE — Progress Notes (Signed)
   Subjective:    Patient ID: Erik Bautista, male    DOB: [birth year], 57 y.o.   MRN: [id number]  HPI He complains of difficulty with right shoulder pain over the last several months.  No history of injury or overuse.  He did state that over the weekend he took it easy and the discomfort diminished.   Review of Systems     Objective:    Physical Exam Full motion of the shoulder.  Drop arm test was negative.  Supraspinatus testing was uncomfortable.  Negative sulcus sign.  No pain on palpation over the shoulder.       Assessment & Plan:  Rotator cuff tendinitis, right Recommend conservative care with 2 Tylenol twice per day for the next 2 weeks.  If continued difficulty set up another appointment for probable injection and referral for physical therapy. At the end of the interview he mentions not taking his statin drug and I encouraged him to start taking it again.

## 2023-04-03 ENCOUNTER — Telehealth: Payer: Self-pay

## 2023-04-03 NOTE — Telephone Encounter (Signed)
Called [facility] to schedule overdue appointment with Dr. Luciana Axe, no answer. Left HIPAA compliant voicemail requesting callback.   Sandie Ano, RN

## 2023-04-14 ENCOUNTER — Telehealth: Payer: Self-pay

## 2023-04-14 ENCOUNTER — Other Ambulatory Visit (HOSPITAL_COMMUNITY): Payer: Self-pay

## 2023-04-14 NOTE — Telephone Encounter (Signed)
[facility] Patient Advocate Encounter  Insurance verification completed.    The patient is insured through [facility]. Patient has Medicare and is not eligible for a copay card, but may be able to apply for patient assistance, if available.    Ran test claim for BIKTARVY,SYMTUZA,DOVATO  The current 30 day co-pay is $0.  We will continue to follow to see if copay assistance is needed.  This test claim was processed through [facility]- copay amounts may vary at other pharmacies due to pharmacy/plan contracts, or as the patient moves through the different stages of their insurance plan.

## 2023-04-15 ENCOUNTER — Other Ambulatory Visit: Payer: Self-pay

## 2023-04-15 ENCOUNTER — Other Ambulatory Visit (HOSPITAL_COMMUNITY)
Admission: RE | Admit: 2023-04-15 | Discharge: 2023-04-15 | Disposition: A | Discharge status: Home / Self Care | Payer: BC Managed Care – PPO | Source: Ambulatory Visit | Attending: Internal Medicine | Admitting: Internal Medicine

## 2023-04-15 ENCOUNTER — Other Ambulatory Visit: Payer: BC Managed Care – PPO

## 2023-04-15 DIAGNOSIS — Z113 Encounter for screening for infections with a predominantly sexual mode of transmission: Secondary | ICD-10-CM | POA: Diagnosis not present

## 2023-04-15 DIAGNOSIS — B20 Human immunodeficiency virus [HIV] disease: Secondary | ICD-10-CM

## 2023-04-15 DIAGNOSIS — Z5181 Encounter for therapeutic drug level monitoring: Secondary | ICD-10-CM

## 2023-04-16 LAB — URINE CYTOLOGY ANCILLARY ONLY
Chlamydia: NEGATIVE
Comment: NEGATIVE
Comment: NORMAL
Neisseria Gonorrhea: NEGATIVE

## 2023-04-16 LAB — T-HELPER CELL (CD4) - (RCID CLINIC ONLY)
CD4 % Helper T Cell: 41 % (ref 33–65)
CD4 T Cell Abs: 1027 /uL (ref 400–1790)

## 2023-04-18 LAB — CBC WITH DIFFERENTIAL/PLATELET
Absolute Lymphocytes: 2815 {cells}/uL (ref 850–3900)
Absolute Monocytes: 628 {cells}/uL (ref 200–950)
Basophils Absolute: 41 {cells}/uL (ref 0–200)
Basophils Relative: 0.6 %
Eosinophils Absolute: 331 {cells}/uL (ref 15–500)
Eosinophils Relative: 4.8 %
HCT: 41.6 % (ref 38.5–50.0)
Hemoglobin: 13.8 g/dL (ref 13.2–17.1)
MCH: 29.8 pg (ref 27.0–33.0)
MCHC: 33.2 g/dL (ref 32.0–36.0)
MCV: 89.8 fL (ref 80.0–100.0)
MPV: 10.1 fL (ref 7.5–12.5)
Monocytes Relative: 9.1 %
Neutro Abs: 3084 {cells}/uL (ref 1500–7800)
Neutrophils Relative %: 44.7 %
Platelets: 298 10*3/uL (ref 140–400)
RBC: 4.63 10*6/uL (ref 4.20–5.80)
RDW: 13.5 % (ref 11.0–15.0)
Total Lymphocyte: 40.8 %
WBC: 6.9 10*3/uL (ref 3.8–10.8)

## 2023-04-18 LAB — COMPLETE METABOLIC PANEL WITH GFR
AG Ratio: 1.5 (calc) (ref 1.0–2.5)
ALT: 24 U/L (ref 9–46)
AST: 17 U/L (ref 10–35)
Albumin: 4.6 g/dL (ref 3.6–5.1)
Alkaline phosphatase (APISO): 66 U/L (ref 35–144)
BUN: 19 mg/dL (ref 7–25)
CO2: 24 mmol/L (ref 20–32)
Calcium: 9.7 mg/dL (ref 8.6–10.3)
Chloride: 104 mmol/L (ref 98–110)
Creat: 1.24 mg/dL (ref 0.70–1.30)
Globulin: 3 g/dL (ref 1.9–3.7)
Glucose, Bld: 98 mg/dL (ref 65–99)
Potassium: 4 mmol/L (ref 3.5–5.3)
Sodium: 139 mmol/L (ref 135–146)
Total Bilirubin: 0.6 mg/dL (ref 0.2–1.2)
Total Protein: 7.6 g/dL (ref 6.1–8.1)
eGFR: 67 mL/min/{1.73_m2} (ref >=60)

## 2023-04-18 LAB — HIV-1 RNA QUANT-NO REFLEX-BLD
HIV 1 RNA Quant: NOT DETECTED {copies}/mL
HIV-1 RNA Quant, Log: NOT DETECTED {Log_copies}/mL

## 2023-04-18 LAB — LIPID PANEL
Cholesterol: 177 mg/dL (ref <200)
HDL: 59 mg/dL (ref >=40)
LDL Cholesterol (Calc): 89 mg/dL
Non-HDL Cholesterol (Calc): 118 mg/dL (ref <130)
Total CHOL/HDL Ratio: 3 (calc) (ref <5.0)
Triglycerides: 202 mg/dL — ABNORMAL HIGH (ref <150)

## 2023-04-18 LAB — RPR: RPR Ser Ql: REACTIVE — AB

## 2023-04-18 LAB — T PALLIDUM AB: T Pallidum Abs: POSITIVE — AB

## 2023-04-18 LAB — RPR TITER: RPR Titer: 1:1 {titer} — ABNORMAL HIGH

## 2023-04-29 ENCOUNTER — Ambulatory Visit (INDEPENDENT_AMBULATORY_CARE_PROVIDER_SITE_OTHER): Payer: BC Managed Care – PPO | Admitting: Internal Medicine

## 2023-04-29 ENCOUNTER — Other Ambulatory Visit: Payer: Self-pay

## 2023-04-29 ENCOUNTER — Encounter: Payer: Self-pay | Admitting: Internal Medicine

## 2023-04-29 VITALS — BP 107/72 | HR 58 | Temp 97.6°F | Wt 184.0 lb

## 2023-04-29 DIAGNOSIS — E785 Hyperlipidemia, unspecified: Secondary | ICD-10-CM

## 2023-04-29 DIAGNOSIS — Z113 Encounter for screening for infections with a predominantly sexual mode of transmission: Secondary | ICD-10-CM

## 2023-04-29 DIAGNOSIS — B20 Human immunodeficiency virus [HIV] disease: Secondary | ICD-10-CM | POA: Diagnosis not present

## 2023-04-29 DIAGNOSIS — Z5181 Encounter for therapeutic drug level monitoring: Secondary | ICD-10-CM

## 2023-04-29 DIAGNOSIS — Z23 Encounter for immunization: Secondary | ICD-10-CM | POA: Diagnosis not present

## 2023-04-29 MED ORDER — ROSUVASTATIN CALCIUM 10 MG PO TABS: 10.0000 mg | ORAL_TABLET | Freq: Every day | ORAL | 11 refills | Status: DC

## 2023-04-29 MED ORDER — BIKTARVY 50-200-25 MG PO TABS: 1.0000 | ORAL_TABLET | Freq: Every day | ORAL | 11 refills | Status: DC

## 2023-04-29 NOTE — Assessment & Plan Note (Signed)
High LDL and with HIV per [facility], has started on a statin and back on now after encouragement by Dr. Richardo Hanks.   Lipid panel reviewed with him Discussed benefits He will continue

## 2023-04-29 NOTE — Progress Notes (Signed)
   Subjective:    Patient ID: Erik Bautista, male    DOB: Apr 01, 1966, 57 y.o.   MRN: [id number]  HPI Erik Bautista is here for follow up of HIV He continues on Biktarvy with no missed doses.  No issues with getting or taking his medication.  No complaints today.  He did stop the rosuvastatin previously but restarted it now.  No new complaints.     Review of Systems  Constitutional:  Negative for fatigue.  Gastrointestinal:  Negative for diarrhea.  Skin:  Negative for rash.       Objective:   Physical Exam Eyes:     General: No scleral icterus. Pulmonary:     Effort: Pulmonary effort is normal.  Neurological:     Mental Status: He is alert.   SH: no tobacco        Assessment & Plan:

## 2023-04-29 NOTE — Assessment & Plan Note (Signed)
Screened negative

## 2023-04-29 NOTE — Assessment & Plan Note (Signed)
He continues to do well on Biktarvy and no changes indicated. Refills sent Labs reviewed with him Follow up in 6 months.

## 2023-04-29 NOTE — Addendum Note (Signed)
Addended by: Juanita Laster on: 04/29/2023 01:30 PM   Modules accepted: Orders

## 2023-09-08 ENCOUNTER — Other Ambulatory Visit: Payer: Self-pay | Admitting: Internal Medicine

## 2023-09-10 ENCOUNTER — Other Ambulatory Visit: Payer: Self-pay

## 2023-09-10 ENCOUNTER — Other Ambulatory Visit

## 2023-09-10 DIAGNOSIS — B20 Human immunodeficiency virus [HIV] disease: Secondary | ICD-10-CM

## 2023-09-11 LAB — T-HELPER CELL (CD4) - (RCID CLINIC ONLY)
CD4 % Helper T Cell: 33 % (ref 33–65)
CD4 T Cell Abs: 697 /uL (ref 400–1790)

## 2023-09-13 LAB — HIV-1 RNA QUANT-NO REFLEX-BLD
HIV 1 RNA Quant: NOT DETECTED {copies}/mL
HIV-1 RNA Quant, Log: NOT DETECTED {Log_copies}/mL

## 2023-09-23 ENCOUNTER — Ambulatory Visit (INDEPENDENT_AMBULATORY_CARE_PROVIDER_SITE_OTHER): Admitting: Internal Medicine

## 2023-09-23 ENCOUNTER — Other Ambulatory Visit: Payer: Self-pay

## 2023-09-23 DIAGNOSIS — Z21 Asymptomatic human immunodeficiency virus [HIV] infection status: Secondary | ICD-10-CM

## 2023-09-23 DIAGNOSIS — B20 Human immunodeficiency virus [HIV] disease: Secondary | ICD-10-CM

## 2023-09-23 MED ORDER — BIKTARVY 50-200-25 MG PO TABS: 1.0000 | ORAL_TABLET | Freq: Every day | ORAL | 11 refills | Status: DC

## 2023-09-23 NOTE — Progress Notes (Signed)
 [facility]     HPI: Erik Bautista is a 58 y.o. male presents for HIV management.Misses about once a week dose of biktarvy . Pt works nights so sometime he forget.   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 Etoh/drug/tobacco use: no/no/no quit 25 years ago  Past Medical History:  Diagnosis Date   Carpal tunnel syndrome    Depression    HIV infection (HCC)    Hyperlipidemia    Insomnia    Spinal stenosis     Past Surgical History:  Procedure Laterality Date   WRIST ARTHROPLASTY      Family History  Problem Relation Age of Onset   Colon cancer Neg Hx    Esophageal cancer Neg Hx    Rectal cancer Neg Hx    Stomach cancer Neg Hx    Current Outpatient Medications on File Prior to Visit  Medication Sig Dispense Refill   bictegravir-emtricitabine -tenofovir  AF (BIKTARVY ) 50-200-25 MG TABS tablet Take 1 tablet by mouth daily. 30 tablet 11   calcium  carbonate (TUMS - DOSED IN MG ELEMENTAL CALCIUM ) 500 MG chewable tablet Chew 2 tablets by mouth as needed for indigestion or heartburn.     Multiple Vitamins-Minerals (MENS 50+ MULTI VITAMIN/MIN PO) Take 1 tablet by mouth daily.     rosuvastatin  (CRESTOR ) 10 MG tablet Take 1 tablet (10 mg total) by mouth daily. 30 tablet 11   traZODone  (DESYREL ) 50 MG tablet TAKE 1 TABLET(50 MG) BY MOUTH AT BEDTIME AS NEEDED FOR SLEEP 30 tablet 11   hydrocortisone  (ANUSOL -HC) 25 MG suppository Place 1 suppository (25 mg total) rectally 2 (two) times daily. (Patient not taking: Reported on 09/23/2023) 12 suppository 0   No current facility-administered medications on file prior to visit.    No Known Allergies    Lab Results HIV 1 RNA Quant  Date Value  09/10/2023 NOT DETECTED copies/mL  04/15/2023 Not Detected Copies/mL  06/12/2022 Not Detected Copies/mL   HIV-1 RNA Viral Load (no units)  Date Value   06/10/2018 <40  10/31/2017 <40  05/07/2017 <40   CD4 (no units)  Date Value  03/06/2015 1,003  12/10/2011 927  10/02/2011 895   CD4 T Cell Abs (/uL)  Date Value  09/10/2023 697  04/15/2023 1,027  06/12/2022 819   No results found for: "HIV1GENOSEQ" Lab Results  Component Value Date   WBC 6.9 04/15/2023   HGB 13.8 04/15/2023   HCT 41.6 04/15/2023   MCV 89.8 04/15/2023   PLT 298 04/15/2023    Lab Results  Component Value Date   CREATININE 1.24 04/15/2023   BUN 19 04/15/2023   NA 139 04/15/2023   K 4.0 04/15/2023   CL 104 04/15/2023   CO2 24 04/15/2023   Lab Results  Component Value Date   ALT 24 04/15/2023   AST 17 04/15/2023   ALKPHOS 89 02/25/2020   BILITOT 0.6 04/15/2023    Lab Results  Component Value Date   CHOL 177 04/15/2023   TRIG 202 (H) 04/15/2023   HDL 59 04/15/2023   LDLCALC 89 04/15/2023   Lab Results  Component Value Date   HAV POS (A) 12/08/2009   Lab Results  Component Value Date   HEPBSAG NEGATIVE 06/15/2012   HEPBSAB POS (A) 05/23/2008   Lab Results  Component Value Date   HCVAB REACTIVE (A) 03/22/2015   Lab Results  Component Value  Date   CHLAMYDIAWP Negative 04/15/2023   N Negative 04/15/2023   No results found for: "GCPROBEAPT" No results found for: "QUANTGOLD"  Assessment/Plan #HIV/Asymptomatic -CD4 697, VL nd, on 09/10/23 on biktarvy  -Continue Biktarvy  -D/U in 6 months    #Vaccination COVID Flu 04/29/23 Monkeypox PCV 20 on 04/29/23 Meningitis 1st dose 2020 HepA immune HEpB immune c and s ab + Tdap 2016 Shingles   #HCV nd 2016 s genotype 3 with active virus and on treatment with ribavirin and sofosbuvir which he finished in 2015.    #Health maintenance -Quantiferon, [place] -RPR1:1 on 04/15/23. Treated in 2014 bicillin  x 3 2015  -GC urine negative on 04/15/23 -Lipid The 10-year ASCVD risk score (Arnett DK, et al., 2019) is: 4.7%   Values used to calculate the score:     Age: 45 years     Sex: Male      Is Non-Hispanic African American: No     Diabetic: No     Tobacco smoker: No     Systolic Blood Pressure: 108 mmHg     Is BP treated: No     HDL Cholesterol: 59 mg/dL     Total Cholesterol: 177 mg/dL  -[id number]->[id number]    Erik Bjornstad, MD [facility] I have personally spent 40 minutes involved in face-to-face and non-face-to-face activities for this patient on the day of the visit. Professional time spent includes the following activities: Preparing to see the patient (review of tests), Obtaining and/or reviewing separately obtained history (admission/discharge record), Performing a medically appropriate examination and/or evaluation , Ordering medications/tests/procedures, referring and communicating with other health care professionals, Documenting clinical information in the EMR, Independently interpreting results (not separately reported), Communicating results to the patient/family/caregiver, Counseling and educating the patient/family/caregiver and Care coordination (not separately reported).

## 2023-11-03 DIAGNOSIS — H40013 Open angle with borderline findings, low risk, bilateral: Secondary | ICD-10-CM | POA: Diagnosis not present

## 2023-11-03 DIAGNOSIS — H2513 Age-related nuclear cataract, bilateral: Secondary | ICD-10-CM | POA: Diagnosis not present

## 2023-11-03 DIAGNOSIS — H18413 Arcus senilis, bilateral: Secondary | ICD-10-CM | POA: Diagnosis not present

## 2023-11-03 DIAGNOSIS — H179 Unspecified corneal scar and opacity: Secondary | ICD-10-CM | POA: Diagnosis not present

## 2024-01-26 ENCOUNTER — Ambulatory Visit: Payer: Self-pay | Admitting: Family Medicine

## 2024-01-26 ENCOUNTER — Ambulatory Visit
Admission: RE | Admit: 2024-01-26 | Discharge: 2024-01-26 | Disposition: A | Discharge status: Home / Self Care | Source: Ambulatory Visit | Attending: Family Medicine | Admitting: Family Medicine

## 2024-01-26 ENCOUNTER — Encounter: Payer: Self-pay | Admitting: Family Medicine

## 2024-01-26 VITALS — BP 114/74 | HR 54 | Ht 70.0 in | Wt 184.0 lb

## 2024-01-26 DIAGNOSIS — M47812 Spondylosis without myelopathy or radiculopathy, cervical region: Secondary | ICD-10-CM | POA: Diagnosis not present

## 2024-01-26 DIAGNOSIS — Z0000 Encounter for general adult medical examination without abnormal findings: Secondary | ICD-10-CM

## 2024-01-26 DIAGNOSIS — M5412 Radiculopathy, cervical region: Secondary | ICD-10-CM

## 2024-01-26 DIAGNOSIS — M19011 Primary osteoarthritis, right shoulder: Secondary | ICD-10-CM | POA: Diagnosis not present

## 2024-01-26 DIAGNOSIS — Z23 Encounter for immunization: Secondary | ICD-10-CM

## 2024-01-26 DIAGNOSIS — M19012 Primary osteoarthritis, left shoulder: Secondary | ICD-10-CM | POA: Diagnosis not present

## 2024-01-26 DIAGNOSIS — B20 Human immunodeficiency virus [HIV] disease: Secondary | ICD-10-CM | POA: Diagnosis not present

## 2024-01-26 MED ORDER — MELOXICAM 15 MG PO TABS
15.0000 mg | ORAL_TABLET | Freq: Every day | ORAL | 0 refills | Status: AC
Start: 2024-01-26 — End: ?

## 2024-01-26 NOTE — Progress Notes (Signed)
 Name: Erik Bautista   Date of Visit: 01/26/24   Date of last visit with me: Visit date not found   CHIEF COMPLAINT:  Chief Complaint  Patient presents with   Annual Exam    Physical. Feels pain on the right side of arm that never goes away. Had the same symptom about a year ago for the left arm.        HPI:  Discussed the use of AI scribe software for clinical note transcription with the patient, who gave verbal consent to proceed.  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  He experiences persistent pain in his right arm, particularly in the shoulder area, which is most pronounced in the morning upon waking. The pain extends throughout the arm and into the hands, where he also experiences numbness. Over-the-counter Tylenol  provides some relief.  He recalls a similar episode last year affecting his left arm, which resolved with Advil. Approximately seven years ago, he had an MRI of the neck after experiencing similar symptoms. At that time, he managed the condition with ibuprofen.  Six years ago, he underwent surgery for a disc herniation at a neurological surgery center; he recalls that the surgeon accessed the area through the front of his neck, but he is unsure of the exact procedure performed. Post-surgery, he experienced significant relief for several years. Currently, he denies any neck pain but reports the pain is localized to the shoulder and hand without any associated weakness.  He is currently taking Biktarvy  and inquires about the compatibility of new medications with his current regimen. He has not received the flu vaccine this year and is unsure about his history with the shingles vaccine. He has received three COVID-19 vaccinations but not the most recent one.         OBJECTIVE:       01/26/2024   10:18 AM  Depression screen PHQ 2/9  Decreased Interest 0   Down, Depressed, Hopeless 0  PHQ - 2 Score 0     BP Readings from Last 3 Encounters:  01/26/24 114/74  09/23/23 108/72  04/29/23 107/72    BP 114/74   Pulse (!) 54   Ht 5' 10 (1.778 m)   Wt 184 lb (83.5 kg)   SpO2 97%   BMI 26.40 kg/m    Physical Exam          Physical Exam Constitutional:      Appearance: Normal appearance.  Neurological:     General: No focal deficit present.     Mental Status: He is alert and oriented to person, place, and time. Mental status is at baseline.   Inspection reveals no gross abnormalities of the cervical spine or bilateral shoulder.  Mild tenderness palpation over the bilateral shoulder area.  No tenderness over the cervical spine with flexion or extension.  There is some noted pain throughout the biceps region bilaterally.  Strength is 5 out of 5 and range of motion is full. ASSESSMENT/PLAN:   Assessment & Plan Annual physical exam  Cervical radiculitis  Human immunodeficiency virus (HIV) disease (HCC)  Need for shingles vaccine    Assessment and Plan    Cervical radiculopathy with right shoulder and arm pain Chronic cervical radiculopathy likely due to cervical spine issues, with symptoms suggesting cervical origin over shoulder joint pathology. - Prescribed meloxicam  for 10 days, once daily with food. -  Ordered x-rays of neck and right shoulder. - Provided neck exercises to strengthen muscles and reduce symptoms. - Scheduled follow-up in 4-6 weeks to assess response to treatment and imaging results. - Discussed potential need for future MRI and steroid injections if symptoms worsen. - Consider surgery in the distant future if condition significantly deteriorates.  General Health Maintenance Recommended shingles vaccine due to age and potential risk. Discussed flu and COVID-19 vaccines as part of routine health maintenance. - Advised to call for flu vaccine appointment after September 2nd. - Discussed COVID-19 vaccine as  similar to flu vaccine, recommended annual vaccination.         Erik Bautista A. Vita MD [facility]

## 2024-01-27 ENCOUNTER — Ambulatory Visit: Payer: Self-pay | Admitting: Family Medicine

## 2024-01-27 LAB — LIPID PANEL
Chol/HDL Ratio: 3.3 ratio (ref 0.0–5.0)
Cholesterol, Total: 187 mg/dL (ref 100–199)
HDL: 57 mg/dL (ref >39)
LDL Chol Calc (NIH): 97 mg/dL (ref 0–99)
Triglycerides: 194 mg/dL — ABNORMAL HIGH (ref 0–149)
VLDL Cholesterol Cal: 33 mg/dL (ref 5–40)

## 2024-01-27 LAB — CBC WITH DIFFERENTIAL/PLATELET
Basophils Absolute: 0 x10E3/uL (ref 0.0–0.2)
Basos: 1 %
EOS (ABSOLUTE): 0.2 x10E3/uL (ref 0.0–0.4)
Eos: 3 %
Hematocrit: 42.4 % (ref 37.5–51.0)
Hemoglobin: 13.8 g/dL (ref 13.0–17.7)
Immature Grans (Abs): 0 x10E3/uL (ref 0.0–0.1)
Immature Granulocytes: 0 %
Lymphocytes Absolute: 2.3 x10E3/uL (ref 0.7–3.1)
Lymphs: 35 %
MCH: 30.1 pg (ref 26.6–33.0)
MCHC: 32.5 g/dL (ref 31.5–35.7)
MCV: 92 fL (ref 79–97)
Monocytes Absolute: 0.7 x10E3/uL (ref 0.1–0.9)
Monocytes: 10 %
Neutrophils Absolute: 3.4 x10E3/uL (ref 1.4–7.0)
Neutrophils: 51 %
Platelets: 274 x10E3/uL (ref 150–450)
RBC: 4.59 x10E6/uL (ref 4.14–5.80)
RDW: 13.6 % (ref 11.6–15.4)
WBC: 6.6 x10E3/uL (ref 3.4–10.8)

## 2024-01-27 LAB — COMPREHENSIVE METABOLIC PANEL WITH GFR
ALT: 24 IU/L (ref 0–44)
AST: 20 IU/L (ref 0–40)
Albumin: 4.5 g/dL (ref 3.8–4.9)
Alkaline Phosphatase: 73 IU/L (ref 44–121)
BUN/Creatinine Ratio: 11 (ref 10–24)
BUN: 13 mg/dL (ref 8–27)
Bilirubin Total: 0.7 mg/dL (ref 0.0–1.2)
CO2: 16 mmol/L — ABNORMAL LOW (ref 20–29)
Calcium: 9.6 mg/dL (ref 8.6–10.2)
Chloride: 106 mmol/L (ref 96–106)
Creatinine, Ser: 1.21 mg/dL (ref 0.76–1.27)
Globulin, Total: 2.8 g/dL (ref 1.5–4.5)
Glucose: 86 mg/dL (ref 70–99)
Potassium: 4.4 mmol/L (ref 3.5–5.2)
Sodium: 140 mmol/L (ref 134–144)
Total Protein: 7.3 g/dL (ref 6.0–8.5)
eGFR: 69 mL/min/1.73 (ref >59)

## 2024-03-05 ENCOUNTER — Other Ambulatory Visit: Payer: Self-pay

## 2024-03-05 DIAGNOSIS — Z113 Encounter for screening for infections with a predominantly sexual mode of transmission: Secondary | ICD-10-CM

## 2024-03-05 DIAGNOSIS — B20 Human immunodeficiency virus [HIV] disease: Secondary | ICD-10-CM

## 2024-03-08 ENCOUNTER — Ambulatory Visit: Admitting: Family Medicine

## 2024-03-10 ENCOUNTER — Other Ambulatory Visit: Payer: Self-pay

## 2024-03-10 ENCOUNTER — Other Ambulatory Visit

## 2024-03-10 ENCOUNTER — Other Ambulatory Visit (HOSPITAL_COMMUNITY)
Admission: RE | Admit: 2024-03-10 | Discharge: 2024-03-10 | Disposition: A | Discharge status: Home / Self Care | Source: Ambulatory Visit | Attending: Internal Medicine | Admitting: Internal Medicine

## 2024-03-10 DIAGNOSIS — Z113 Encounter for screening for infections with a predominantly sexual mode of transmission: Secondary | ICD-10-CM

## 2024-03-10 DIAGNOSIS — B20 Human immunodeficiency virus [HIV] disease: Secondary | ICD-10-CM | POA: Insufficient documentation

## 2024-03-12 LAB — T-HELPER CELL (CD4) - (RCID CLINIC ONLY)
CD4 % Helper T Cell: 37 % (ref 33–65)
CD4 T Cell Abs: 803 /uL (ref 400–1790)

## 2024-03-12 LAB — URINE CYTOLOGY ANCILLARY ONLY
Chlamydia: NEGATIVE
Comment: NEGATIVE
Comment: NORMAL
Neisseria Gonorrhea: NEGATIVE

## 2024-03-13 LAB — COMPLETE METABOLIC PANEL WITHOUT GFR
AG Ratio: 1.6 (calc) (ref 1.0–2.5)
ALT: 22 U/L (ref 9–46)
AST: 19 U/L (ref 10–35)
Albumin: 5 g/dL (ref 3.6–5.1)
Alkaline phosphatase (APISO): 63 U/L (ref 35–144)
BUN/Creatinine Ratio: 11 (calc) (ref 6–22)
BUN: 17 mg/dL (ref 7–25)
CO2: 28 mmol/L (ref 20–32)
Calcium: 10.3 mg/dL (ref 8.6–10.3)
Chloride: 103 mmol/L (ref 98–110)
Creat: 1.48 mg/dL — ABNORMAL HIGH (ref 0.70–1.35)
Globulin: 3.1 g/dL (ref 1.9–3.7)
Glucose, Bld: 87 mg/dL (ref 65–99)
Potassium: 4.2 mmol/L (ref 3.5–5.3)
Sodium: 140 mmol/L (ref 135–146)
Total Bilirubin: 0.7 mg/dL (ref 0.2–1.2)
Total Protein: 8.1 g/dL (ref 6.1–8.1)

## 2024-03-13 LAB — T PALLIDUM AB: T Pallidum Abs: POSITIVE — AB

## 2024-03-13 LAB — CBC WITH DIFFERENTIAL/PLATELET
Absolute Lymphocytes: 2522 {cells}/uL (ref 850–3900)
Absolute Monocytes: 557 {cells}/uL (ref 200–950)
Basophils Absolute: 32 {cells}/uL (ref 0–200)
Basophils Relative: 0.5 %
Eosinophils Absolute: 218 {cells}/uL (ref 15–500)
Eosinophils Relative: 3.4 %
HCT: 44.5 % (ref 38.5–50.0)
Hemoglobin: 15 g/dL (ref 13.2–17.1)
MCH: 30.2 pg (ref 27.0–33.0)
MCHC: 33.7 g/dL (ref 32.0–36.0)
MCV: 89.7 fL (ref 80.0–100.0)
MPV: 9.9 fL (ref 7.5–12.5)
Monocytes Relative: 8.7 %
Neutro Abs: 3072 {cells}/uL (ref 1500–7800)
Neutrophils Relative %: 48 %
Platelets: 322 Thousand/uL (ref 140–400)
RBC: 4.96 Million/uL (ref 4.20–5.80)
RDW: 13.3 % (ref 11.0–15.0)
Total Lymphocyte: 39.4 %
WBC: 6.4 Thousand/uL (ref 3.8–10.8)

## 2024-03-13 LAB — HIV-1 RNA QUANT-NO REFLEX-BLD
HIV 1 RNA Quant: NOT DETECTED {copies}/mL
HIV-1 RNA Quant, Log: NOT DETECTED {Log_copies}/mL

## 2024-03-13 LAB — RPR TITER: RPR Titer: 1:2 {titer} — ABNORMAL HIGH

## 2024-03-13 LAB — RPR: RPR Ser Ql: REACTIVE — AB

## 2024-03-22 ENCOUNTER — Ambulatory Visit (INDEPENDENT_AMBULATORY_CARE_PROVIDER_SITE_OTHER): Admitting: Family Medicine

## 2024-03-22 ENCOUNTER — Encounter: Payer: Self-pay | Admitting: Family Medicine

## 2024-03-22 VITALS — BP 126/84 | HR 56 | Wt 181.4 lb

## 2024-03-22 DIAGNOSIS — R21 Rash and other nonspecific skin eruption: Secondary | ICD-10-CM | POA: Diagnosis not present

## 2024-03-22 DIAGNOSIS — M5412 Radiculopathy, cervical region: Secondary | ICD-10-CM | POA: Diagnosis not present

## 2024-03-22 DIAGNOSIS — E785 Hyperlipidemia, unspecified: Secondary | ICD-10-CM | POA: Diagnosis not present

## 2024-03-22 MED ORDER — METHYLPREDNISOLONE 4 MG PO TBPK: ORAL_TABLET | ORAL | 0 refills | Status: DC

## 2024-03-22 MED ORDER — GABAPENTIN 300 MG PO CAPS: 300.0000 mg | ORAL_CAPSULE | Freq: Three times a day (TID) | ORAL | 3 refills | Status: DC

## 2024-03-22 MED ORDER — HYDROCORTISONE 1 % EX OINT: 1.0000 | TOPICAL_OINTMENT | Freq: Two times a day (BID) | CUTANEOUS | 0 refills | Status: AC

## 2024-03-22 MED ORDER — ROSUVASTATIN CALCIUM 10 MG PO TABS: 10.0000 mg | ORAL_TABLET | Freq: Every day | ORAL | 11 refills | Status: AC

## 2024-03-22 NOTE — Progress Notes (Signed)
   Name: Erik Bautista   Date of Visit: 03/22/24   Date of last visit with me: 03/08/2024   CHIEF COMPLAINT:  Chief Complaint  Patient presents with   Follow-up    Follow up on labs        HPI:  Discussed the use of AI scribe software for clinical note transcription with the patient, who gave verbal consent to proceed.  History of Present Illness   Erik Bautista is a 58 year old male who presents with persistent arm pain.  He has been experiencing persistent arm pain for nearly two months, primarily located in the shoulder area, without radiation into the fingers. The pain is exacerbated by lifting his arm and is accompanied by numbness in the hand upon waking. He is concerned about weakness in the arm, which affects his ability to lift objects.  He has a history of neck surgery six years ago. He has been taking Tylenol  and meloxicam  for pain relief, which provide temporary relief, but the pain returns once he stops. He has also been performing neck exercises without significant improvement.  He mentions waking up with a red, hard, and thicker area on his skin, which is not itchy or scratchy. He does not recall any unusual activities that might have caused this.  He is currently taking meloxicam  as needed and has a prescription for rosuvastatin , which he requested to be authorized.         OBJECTIVE:       01/26/2024   10:18 AM  Depression screen PHQ 2/9  Decreased Interest 0  Down, Depressed, Hopeless 0  PHQ - 2 Score 0     BP Readings from Last 3 Encounters:  03/22/24 126/84  01/26/24 114/74  09/23/23 108/72    BP 126/84   Pulse (!) 56   Wt 181 lb 6.4 oz (82.3 kg)   SpO2 97%   BMI 26.03 kg/m    Physical Exam   MUSCULOSKELETAL: Pain on palpation of shoulder. SKIN: Skin on arm red and thickened, non-itchy.      Physical Exam Constitutional:      Appearance: Normal appearance.  Neurological:     General: Bautista focal deficit present.     Mental Status:  He is alert and oriented to person, place, and time. Mental status is at baseline.     ASSESSMENT/PLAN:   Assessment & Plan Cervical radiculitis  Hyperlipidemia, unspecified hyperlipidemia type  Rash    Assessment and Plan    Cervical radiculopathy with degenerative cervical spine disease Chronic cervical radiculopathy with degenerative changes, including facet arthropathy and narrowing. Symptoms likely due to arthritis and genetic predisposition. X-rays show degenerative changes and compression. - Prescribed steroid dose pack to reduce inflammation. - Prescribed gabapentin  300 mg, one pill three times a day for nerve pain management. - Continue meloxicam  as needed after completing steroid pack. - Ordered MRI of the cervical spine to assess for nerve compression and potential need for further intervention. - Scheduled follow-up appointment three weeks after MRI.  Localized skin thickening and erythema, unspecified site Acute localized skin thickening and erythema on the arm. - Prescribed oral steroid as part of treatment for cervical radiculopathy, which may also help with skin condition. - Recommended over-the-counter topical steroid (1% hydrocortisone ) for localized application.  Hyperlipidemia Chronic hyperlipidemia managed with rosuvastatin . - Authorized refill of rosuvastatin  prescription.         Braylynn Ghan A. Vita MD [facility]

## 2024-03-23 ENCOUNTER — Other Ambulatory Visit: Payer: Self-pay | Admitting: Family Medicine

## 2024-03-23 DIAGNOSIS — M5412 Radiculopathy, cervical region: Secondary | ICD-10-CM

## 2024-03-24 ENCOUNTER — Ambulatory Visit: Admitting: Internal Medicine

## 2024-04-08 ENCOUNTER — Ambulatory Visit (INDEPENDENT_AMBULATORY_CARE_PROVIDER_SITE_OTHER): Admitting: Internal Medicine

## 2024-04-08 ENCOUNTER — Encounter: Payer: Self-pay | Admitting: Internal Medicine

## 2024-04-08 ENCOUNTER — Other Ambulatory Visit: Payer: Self-pay

## 2024-04-08 VITALS — BP 120/78 | HR 73 | Temp 98.0°F | Resp 16 | Wt 186.0 lb

## 2024-04-08 DIAGNOSIS — R7989 Other specified abnormal findings of blood chemistry: Secondary | ICD-10-CM | POA: Diagnosis not present

## 2024-04-08 DIAGNOSIS — B20 Human immunodeficiency virus [HIV] disease: Secondary | ICD-10-CM

## 2024-04-08 DIAGNOSIS — Z79899 Other long term (current) drug therapy: Secondary | ICD-10-CM | POA: Diagnosis not present

## 2024-04-08 MED ORDER — BIKTARVY 50-200-25 MG PO TABS: 1.0000 | ORAL_TABLET | Freq: Every day | ORAL | 11 refills | Status: AC

## 2024-04-08 NOTE — Progress Notes (Signed)
 [facility]     HPI: Erik Bautista is a 58 y.o.  male presents for HIV management.Misses about once a week dose of biktarvy . Pt works nights so sometime he forget.   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 Etoh/drug/tobacco use: no/no/no quit 25 years ago  Past Medical History:  Diagnosis Date   Carpal tunnel syndrome    Depression    HIV infection (HCC)    Hyperlipidemia    Insomnia    Spinal stenosis     Past Surgical History:  Procedure Laterality Date   WRIST ARTHROPLASTY      Family History  Problem Relation Age of Onset   Colon cancer Neg Hx    Esophageal cancer Neg Hx    Rectal cancer Neg Hx    Stomach cancer Neg Hx    Current Outpatient Medications on File Prior to Visit  Medication Sig Dispense Refill   bictegravir-emtricitabine -tenofovir  AF (BIKTARVY ) 50-200-25 MG TABS tablet Take 1 tablet by mouth daily. 30 tablet 11   calcium  carbonate (TUMS - DOSED IN MG ELEMENTAL CALCIUM ) 500 MG chewable tablet Chew 2 tablets by mouth as needed for indigestion or heartburn.     gabapentin  (NEURONTIN ) 300 MG capsule Take 1 capsule (300 mg total) by mouth 3 (three) times daily. 90 capsule 3   hydrocortisone  1 % ointment Apply 1 Application topically 2 (two) times daily. 30 g 0   meloxicam  (MOBIC ) 15 MG tablet Take 1 tablet (15 mg total) by mouth daily. 30 tablet 0   Multiple Vitamins-Minerals (MENS 50+ MULTI VITAMIN/MIN PO) Take 1 tablet by mouth daily.     rosuvastatin  (CRESTOR ) 10 MG tablet Take 1 tablet (10 mg total) by mouth daily. 30 tablet 11   methylPREDNISolone (MEDROL DOSEPAK) 4 MG TBPK tablet Take as directed. (Patient not taking: Reported on 04/08/2024) 21 tablet 0   traZODone  (DESYREL ) 50 MG tablet TAKE 1 TABLET(50 MG) BY MOUTH AT BEDTIME AS NEEDED FOR SLEEP (Patient not taking: Reported on 04/08/2024) 30  tablet 11   No current facility-administered medications on file prior to visit.    No Known Allergies    Lab Results HIV 1 RNA Quant  Date Value  03/10/2024 NOT DETECTED copies/mL  09/10/2023 NOT DETECTED copies/mL  04/15/2023 Not Detected Copies/mL   HIV-1 RNA Viral Load (no units)  Date Value  06/10/2018 <40  10/31/2017 <40  05/07/2017 <40   CD4 (no units)  Date Value  03/06/2015 1,003  12/10/2011 927  10/02/2011 895   CD4 T Cell Abs (/uL)  Date Value  03/10/2024 803  09/10/2023 697  04/15/2023 1,027   No results found for: HIV1GENOSEQ Lab Results  Component Value Date   WBC 6.4 03/10/2024   HGB 15.0 03/10/2024   HCT 44.5 03/10/2024   MCV 89.7 03/10/2024   PLT 322 03/10/2024    Lab Results  Component Value Date   CREATININE 1.48 (H) 03/10/2024   BUN 17 03/10/2024   NA 140 03/10/2024   K 4.2 03/10/2024   CL 103 03/10/2024   CO2 28 03/10/2024   Lab Results  Component Value Date   ALT 22 03/10/2024   AST 19 03/10/2024   ALKPHOS 73 01/26/2024   BILITOT 0.7 03/10/2024    Lab Results  Component Value Date   CHOL 187 01/26/2024   TRIG 194 (  H) 01/26/2024   HDL 57 01/26/2024   LDLCALC 97 01/26/2024   Lab Results  Component Value Date   HAV POS (A) 12/08/2009   Lab Results  Component Value Date   HEPBSAG NEGATIVE 06/15/2012   HEPBSAB POS (A) 05/23/2008   Lab Results  Component Value Date   HCVAB REACTIVE (A) 03/22/2015   Lab Results  Component Value Date   CHLAMYDIAWP Negative 03/10/2024   N Negative 03/10/2024   No results found for: GCPROBEAPT No results found for: QUANTGOLD  Assessment/Plan #HIV/Asymptomatic -CD4 803, VL nd, on 03/10/24 on biktarvy  -Continue Biktarvy  -F/U in 6 months       #Vaccination COVID-declined Flu 11/26/240 declined Monkeypox PCV 20 on 04/29/23 Meningitis 1st dose 2020 HepA immune HEpB immune c and s ab + Tdap 2016 Shingles    #serum creatinine elevated -recheck pcp - increased  ibuprofen->avoid. On he has some shoulder pain, sees ortho  #HCV nd 2016 s genotype 3 with active virus and on treatment with ribavirin and sofosbuvir which he finished in 2015.     #Health maintenance -Quantiferon, [place] -RPR1:2 . Treated in 2014 bicillin  x 3 2015 -GC urine negative on 04/15/23 -Lipid The 10-year ASCVD risk score (Arnett DK, et al., 2019) is: 6.6%   Values used to calculate the score:     Age: 26 years     Clincally relevant sex: Male     Is Non-Hispanic African American: No     Diabetic: No     Tobacco smoker: No     Systolic Blood Pressure: 120 mmHg     Is BP treated: No     HDL Cholesterol: 57 mg/dL     Total Cholesterol: 187 mg/dL On statin -Rnonwndrneb->7977   Loney Stank, MD [facility] I have personally spent 41 minutes involved in face-to-face and non-face-to-face activities for this patient on the day of the visit. Professional time spent includes the following activities: Preparing to see the patient (review of tests), Obtaining and/or reviewing separately obtained history (admission/discharge record), Performing a medically appropriate examination and/or evaluation , Ordering medications/tests/procedures, referring and communicating with other health care professionals, Documenting clinical information in the EMR, Independently interpreting results (not separately reported), Communicating results to the patient/family/caregiver, Counseling and educating the patient/family/caregiver and Care coordination (not separately reported).

## 2024-04-08 NOTE — Patient Instructions (Signed)
 Lab appt one week prior to next visit F/U in one year

## 2024-04-09 ENCOUNTER — Ambulatory Visit
Admission: RE | Admit: 2024-04-09 | Discharge: 2024-04-09 | Disposition: A | Discharge status: Home / Self Care | Source: Ambulatory Visit | Attending: Family Medicine | Admitting: Family Medicine

## 2024-04-09 DIAGNOSIS — M50121 Cervical disc disorder at C4-C5 level with radiculopathy: Secondary | ICD-10-CM | POA: Diagnosis not present

## 2024-04-09 DIAGNOSIS — M5412 Radiculopathy, cervical region: Secondary | ICD-10-CM

## 2024-04-27 ENCOUNTER — Ambulatory Visit: Admitting: Family Medicine

## 2024-04-27 ENCOUNTER — Encounter: Payer: Self-pay | Admitting: Family Medicine

## 2024-04-27 VITALS — BP 100/60 | HR 68 | Ht 70.0 in | Wt 182.4 lb

## 2024-04-27 DIAGNOSIS — G8929 Other chronic pain: Secondary | ICD-10-CM

## 2024-04-27 DIAGNOSIS — M5412 Radiculopathy, cervical region: Secondary | ICD-10-CM

## 2024-04-27 DIAGNOSIS — M25511 Pain in right shoulder: Secondary | ICD-10-CM

## 2024-04-27 DIAGNOSIS — M25512 Pain in left shoulder: Secondary | ICD-10-CM

## 2024-04-27 MED ORDER — GABAPENTIN 300 MG PO CAPS
300.0000 mg | ORAL_CAPSULE | Freq: Three times a day (TID) | ORAL | 3 refills | Status: AC
Start: 2024-04-27 — End: ?

## 2024-04-27 NOTE — Progress Notes (Signed)
   Name: Erik Bautista   Date of Visit: 04/27/24   Date of last visit with me: 03/22/2024   CHIEF COMPLAINT:  Chief Complaint  Patient presents with   Follow-up    Patient is here to go over C Spine MRI results.        HPI:  Discussed the use of AI scribe software for clinical note transcription with the patient, who gave verbal consent to proceed.  History of Present Illness   Erik Bautista No is a 58 year old male with cervical spine arthritis who presents with arm pain and numbness.  He experiences significant improvement in arm symptoms with medication use. The numbness in his hand has resolved, though he still has occasional arm pain, especially when lifting objects. Previously, the pain and numbness extended from his neck to his fingers, but this has improved. The current pain is intermittent and less severe.  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 He has a history of cervical spine issues, including past surgeries and injuries.  He is currently taking gabapentin , which effectively manages his symptoms. He also takes Biktarvy  for HIV and confirms no interactions with his current medications for arm pain.  No tenderness in the shoulder despite some joint changes noted on previous x-rays. He experiences soreness in the shoulder upon waking and when lifting heavy objects, but it is not severe. No significant shoulder pain at rest.         OBJECTIVE:       01/26/2024   10:18 AM  Depression screen PHQ 2/9  Decreased Interest 0  Down, Depressed, Hopeless 0  PHQ - 2 Score 0     BP Readings from Last 3 Encounters:  04/27/24 100/60  04/08/24 120/78  03/22/24 126/84    BP 100/60   Pulse 68   Ht 5' 10 (1.778 m)   Wt 182 lb 6.4 oz (82.7 kg)   BMI 26.17 kg/m    Physical Exam          Physical Exam Constitutional:      Appearance: Normal appearance.  Neurological:     General: No focal deficit present.     Mental Status: He is alert  and oriented to person, place, and time. Mental status is at baseline.     ASSESSMENT/PLAN:   Assessment & Plan Cervical radiculitis  Chronic pain of both shoulders    Assessment and Plan    Chronic Cervical radiculopathy Symptoms improved with gabapentin . MRI shows mild facet arthropathy at C7-T1 without significant nerve compression. Symptoms likely due to age-related changes and nerve irritation. No surgical intervention needed. - Continue gabapentin  for nerve pain management. - Reassess in 4-5 months for medication adjustment.  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 Improvement with gabapentin  suggests nerve involvement. Steroid injection considered if pain persists or worsens. - Monitor shoulder pain, consider steroid injection if pain persists or worsens. - Reassess in 4-5 months for further intervention.         Erik Bautista A. Vita MD [facility]

## 2025-01-27 ENCOUNTER — Encounter: Payer: Self-pay | Admitting: Family Medicine
